# Patient Record
Sex: Female | Born: 1945 | Race: White | Hispanic: No | Marital: Single | Smoking: Never smoker
Health system: Southern US, Community
[De-identification: ages and names within clinical notes are randomized; demographics above are authoritative.]

## PROBLEM LIST (undated history)

## (undated) DIAGNOSIS — I10 Essential (primary) hypertension: Secondary | ICD-10-CM

## (undated) DIAGNOSIS — Z9889 Other specified postprocedural states: Secondary | ICD-10-CM

## (undated) DIAGNOSIS — E119 Type 2 diabetes mellitus without complications: Secondary | ICD-10-CM

## (undated) DIAGNOSIS — F79 Unspecified intellectual disabilities: Secondary | ICD-10-CM

## (undated) DIAGNOSIS — K449 Diaphragmatic hernia without obstruction or gangrene: Secondary | ICD-10-CM

## (undated) HISTORY — DX: Unspecified intellectual disabilities: F79

## (undated) HISTORY — PX: ABDOMINAL HYSTERECTOMY: SHX81

## (undated) HISTORY — DX: Diaphragmatic hernia without obstruction or gangrene: K44.9

## (undated) HISTORY — DX: Other specified postprocedural states: Z98.890

---

## 1998-01-07 ENCOUNTER — Encounter: Payer: Self-pay | Admitting: Emergency Medicine

## 1998-01-07 ENCOUNTER — Emergency Department (HOSPITAL_COMMUNITY): Admission: EM | Admit: 1998-01-07 | Discharge: 1998-01-07 | Payer: Self-pay | Admitting: Emergency Medicine

## 1999-01-05 ENCOUNTER — Encounter (HOSPITAL_COMMUNITY): Admission: RE | Admit: 1999-01-05 | Discharge: 1999-04-05 | Payer: Self-pay | Admitting: Dentistry

## 1999-01-06 ENCOUNTER — Encounter (HOSPITAL_COMMUNITY): Payer: Self-pay | Admitting: Dentistry

## 1999-01-12 ENCOUNTER — Encounter (HOSPITAL_COMMUNITY): Payer: Self-pay | Admitting: Dentistry

## 1999-01-12 ENCOUNTER — Ambulatory Visit (HOSPITAL_COMMUNITY): Admission: RE | Admit: 1999-01-12 | Discharge: 1999-01-12 | Payer: Self-pay | Admitting: Dentistry

## 2000-01-17 ENCOUNTER — Encounter: Payer: Self-pay | Admitting: Emergency Medicine

## 2000-01-17 ENCOUNTER — Emergency Department (HOSPITAL_COMMUNITY): Admission: EM | Admit: 2000-01-17 | Discharge: 2000-01-17 | Payer: Self-pay | Admitting: Emergency Medicine

## 2000-02-21 ENCOUNTER — Encounter (HOSPITAL_COMMUNITY): Admission: RE | Admit: 2000-02-21 | Discharge: 2000-05-21 | Payer: Self-pay | Admitting: Dentistry

## 2000-09-04 ENCOUNTER — Encounter (HOSPITAL_COMMUNITY): Admission: RE | Admit: 2000-09-04 | Discharge: 2000-12-03 | Payer: Self-pay | Admitting: Dentistry

## 2001-03-18 ENCOUNTER — Encounter (HOSPITAL_COMMUNITY): Admission: RE | Admit: 2001-03-18 | Discharge: 2001-06-16 | Payer: Self-pay | Admitting: Dentistry

## 2001-04-17 LAB — HM COLONOSCOPY

## 2001-06-18 ENCOUNTER — Ambulatory Visit: Admission: RE | Admit: 2001-06-18 | Discharge: 2001-06-18 | Payer: Self-pay | Admitting: Internal Medicine

## 2001-09-25 ENCOUNTER — Ambulatory Visit (HOSPITAL_COMMUNITY): Admission: RE | Admit: 2001-09-25 | Discharge: 2001-09-25 | Payer: Self-pay | Admitting: Gastroenterology

## 2001-10-22 ENCOUNTER — Encounter (HOSPITAL_COMMUNITY): Admission: RE | Admit: 2001-10-22 | Discharge: 2002-01-20 | Payer: Self-pay | Admitting: Dentistry

## 2002-07-02 ENCOUNTER — Encounter: Admission: RE | Admit: 2002-07-02 | Discharge: 2002-07-02 | Payer: Self-pay | Admitting: Family Medicine

## 2002-07-02 ENCOUNTER — Encounter: Payer: Self-pay | Admitting: Family Medicine

## 2002-12-02 ENCOUNTER — Encounter: Admission: RE | Admit: 2002-12-02 | Discharge: 2002-12-02 | Payer: Self-pay | Admitting: Specialist

## 2002-12-02 ENCOUNTER — Encounter: Payer: Self-pay | Admitting: Specialist

## 2002-12-30 ENCOUNTER — Encounter: Admission: EM | Admit: 2002-12-30 | Discharge: 2002-12-30 | Payer: Self-pay | Admitting: Dentistry

## 2004-02-22 ENCOUNTER — Ambulatory Visit: Payer: Self-pay | Admitting: Internal Medicine

## 2004-05-02 ENCOUNTER — Ambulatory Visit: Payer: Self-pay | Admitting: Internal Medicine

## 2004-06-01 ENCOUNTER — Ambulatory Visit: Payer: Self-pay | Admitting: Internal Medicine

## 2004-10-22 ENCOUNTER — Ambulatory Visit: Payer: Self-pay | Admitting: Family Medicine

## 2004-10-25 ENCOUNTER — Ambulatory Visit: Payer: Self-pay | Admitting: Family Medicine

## 2004-10-28 ENCOUNTER — Ambulatory Visit (HOSPITAL_COMMUNITY): Admission: RE | Admit: 2004-10-28 | Discharge: 2004-10-28 | Payer: Self-pay | Admitting: Internal Medicine

## 2004-11-18 ENCOUNTER — Ambulatory Visit: Payer: Self-pay | Admitting: Internal Medicine

## 2004-11-18 ENCOUNTER — Encounter: Admission: RE | Admit: 2004-11-18 | Discharge: 2004-11-18 | Payer: Self-pay | Admitting: Internal Medicine

## 2004-12-06 ENCOUNTER — Ambulatory Visit: Payer: Self-pay | Admitting: Gastroenterology

## 2005-01-02 ENCOUNTER — Ambulatory Visit: Payer: Self-pay | Admitting: Gastroenterology

## 2005-02-07 ENCOUNTER — Ambulatory Visit: Payer: Self-pay | Admitting: Internal Medicine

## 2005-04-17 HISTORY — PX: ESOPHAGOGASTRODUODENOSCOPY: SHX1529

## 2005-05-01 ENCOUNTER — Ambulatory Visit: Payer: Self-pay | Admitting: Internal Medicine

## 2005-05-15 ENCOUNTER — Ambulatory Visit: Payer: Self-pay | Admitting: Internal Medicine

## 2005-06-02 ENCOUNTER — Ambulatory Visit: Payer: Self-pay | Admitting: Internal Medicine

## 2005-06-08 ENCOUNTER — Ambulatory Visit: Payer: Self-pay | Admitting: Internal Medicine

## 2005-06-12 ENCOUNTER — Ambulatory Visit: Payer: Self-pay | Admitting: Gastroenterology

## 2005-06-23 ENCOUNTER — Ambulatory Visit: Payer: Self-pay | Admitting: Gastroenterology

## 2005-07-06 ENCOUNTER — Ambulatory Visit: Payer: Self-pay | Admitting: Internal Medicine

## 2005-07-14 ENCOUNTER — Encounter: Admission: RE | Admit: 2005-07-14 | Discharge: 2005-07-14 | Payer: Self-pay | Admitting: Internal Medicine

## 2005-08-25 ENCOUNTER — Ambulatory Visit: Payer: Self-pay | Admitting: Internal Medicine

## 2006-01-29 ENCOUNTER — Ambulatory Visit: Payer: Self-pay | Admitting: Family Medicine

## 2006-07-27 ENCOUNTER — Ambulatory Visit: Payer: Self-pay | Admitting: Internal Medicine

## 2006-08-10 ENCOUNTER — Ambulatory Visit: Payer: Self-pay | Admitting: Internal Medicine

## 2006-08-20 ENCOUNTER — Ambulatory Visit: Payer: Self-pay | Admitting: Internal Medicine

## 2006-12-04 DIAGNOSIS — J45909 Unspecified asthma, uncomplicated: Secondary | ICD-10-CM | POA: Insufficient documentation

## 2007-02-15 ENCOUNTER — Ambulatory Visit: Payer: Self-pay | Admitting: Internal Medicine

## 2007-11-15 ENCOUNTER — Ambulatory Visit: Payer: Self-pay | Admitting: Internal Medicine

## 2007-11-15 DIAGNOSIS — R071 Chest pain on breathing: Secondary | ICD-10-CM

## 2007-11-15 DIAGNOSIS — F79 Unspecified intellectual disabilities: Secondary | ICD-10-CM

## 2008-02-03 ENCOUNTER — Ambulatory Visit: Payer: Self-pay | Admitting: Internal Medicine

## 2008-03-16 ENCOUNTER — Ambulatory Visit: Payer: Self-pay | Admitting: Internal Medicine

## 2008-03-16 DIAGNOSIS — S59909A Unspecified injury of unspecified elbow, initial encounter: Secondary | ICD-10-CM

## 2008-03-16 DIAGNOSIS — S59919A Unspecified injury of unspecified forearm, initial encounter: Secondary | ICD-10-CM

## 2008-03-16 DIAGNOSIS — S6990XA Unspecified injury of unspecified wrist, hand and finger(s), initial encounter: Secondary | ICD-10-CM | POA: Insufficient documentation

## 2008-03-16 DIAGNOSIS — F411 Generalized anxiety disorder: Secondary | ICD-10-CM | POA: Insufficient documentation

## 2008-03-19 ENCOUNTER — Telehealth: Payer: Self-pay | Admitting: *Deleted

## 2008-07-31 ENCOUNTER — Telehealth: Payer: Self-pay | Admitting: Internal Medicine

## 2008-07-31 ENCOUNTER — Emergency Department (HOSPITAL_COMMUNITY): Admission: EM | Admit: 2008-07-31 | Discharge: 2008-08-01 | Payer: Self-pay | Admitting: Emergency Medicine

## 2008-07-31 ENCOUNTER — Encounter: Payer: Self-pay | Admitting: Internal Medicine

## 2008-09-21 ENCOUNTER — Ambulatory Visit: Payer: Self-pay | Admitting: Internal Medicine

## 2008-09-21 DIAGNOSIS — H6120 Impacted cerumen, unspecified ear: Secondary | ICD-10-CM

## 2008-09-21 DIAGNOSIS — R319 Hematuria, unspecified: Secondary | ICD-10-CM

## 2008-09-21 LAB — CONVERTED CEMR LAB
Bilirubin Urine: NEGATIVE
Glucose, Urine, Semiquant: NEGATIVE
Ketones, urine, test strip: NEGATIVE

## 2008-09-22 ENCOUNTER — Encounter: Payer: Self-pay | Admitting: Internal Medicine

## 2008-10-13 ENCOUNTER — Ambulatory Visit: Payer: Self-pay | Admitting: Internal Medicine

## 2008-10-13 DIAGNOSIS — R519 Headache, unspecified: Secondary | ICD-10-CM | POA: Insufficient documentation

## 2008-10-13 DIAGNOSIS — R51 Headache: Secondary | ICD-10-CM | POA: Insufficient documentation

## 2008-10-13 DIAGNOSIS — R32 Unspecified urinary incontinence: Secondary | ICD-10-CM | POA: Insufficient documentation

## 2008-10-13 LAB — CONVERTED CEMR LAB
Ketones, urine, test strip: NEGATIVE
Specific Gravity, Urine: 1.01
WBC Urine, dipstick: NEGATIVE
pH: 5

## 2008-10-14 ENCOUNTER — Encounter: Payer: Self-pay | Admitting: Internal Medicine

## 2008-10-22 ENCOUNTER — Ambulatory Visit: Payer: Self-pay | Admitting: Internal Medicine

## 2008-10-28 LAB — CONVERTED CEMR LAB
CRP, High Sensitivity: 1 (ref 0.00–5.00)
TSH: 1.09 microintl units/mL (ref 0.35–5.50)

## 2008-10-30 ENCOUNTER — Telehealth: Payer: Self-pay | Admitting: *Deleted

## 2009-02-03 ENCOUNTER — Ambulatory Visit: Payer: Self-pay | Admitting: Internal Medicine

## 2009-02-03 DIAGNOSIS — T50995A Adverse effect of other drugs, medicaments and biological substances, initial encounter: Secondary | ICD-10-CM | POA: Insufficient documentation

## 2009-02-03 DIAGNOSIS — R141 Gas pain: Secondary | ICD-10-CM

## 2009-02-03 DIAGNOSIS — R143 Flatulence: Secondary | ICD-10-CM

## 2009-02-03 DIAGNOSIS — R6250 Unspecified lack of expected normal physiological development in childhood: Secondary | ICD-10-CM

## 2009-02-03 DIAGNOSIS — R197 Diarrhea, unspecified: Secondary | ICD-10-CM

## 2009-02-03 DIAGNOSIS — R142 Eructation: Secondary | ICD-10-CM

## 2009-02-04 ENCOUNTER — Telehealth: Payer: Self-pay | Admitting: Internal Medicine

## 2009-02-08 ENCOUNTER — Telehealth: Payer: Self-pay | Admitting: *Deleted

## 2009-10-04 ENCOUNTER — Ambulatory Visit: Payer: Self-pay | Admitting: Internal Medicine

## 2009-10-04 DIAGNOSIS — L559 Sunburn, unspecified: Secondary | ICD-10-CM

## 2010-01-24 ENCOUNTER — Ambulatory Visit: Payer: Self-pay | Admitting: Internal Medicine

## 2010-04-27 ENCOUNTER — Telehealth: Payer: Self-pay | Admitting: *Deleted

## 2010-04-29 ENCOUNTER — Encounter: Payer: Self-pay | Admitting: Internal Medicine

## 2010-05-08 ENCOUNTER — Encounter: Payer: Self-pay | Admitting: Internal Medicine

## 2010-05-17 ENCOUNTER — Telehealth: Payer: Self-pay | Admitting: *Deleted

## 2010-05-17 ENCOUNTER — Encounter: Payer: Self-pay | Admitting: *Deleted

## 2010-05-17 NOTE — Assessment & Plan Note (Signed)
Summary: FLU SHOT // RS   Nurse Visit   Allergies: No Known Drug Allergies  Orders Added: 1)  Admin 1st Vaccine [90471] 2)  Flu Vaccine 73yrs + [16109]  Flu Vaccine Consent Questions     Do you have a history of severe allergic reactions to this vaccine? no    Any prior history of allergic reactions to egg and/or gelatin? no    Do you have a sensitivity to the preservative Thimersol? no    Do you have a past history of Guillan-Barre Syndrome? no    Do you currently have an acute febrile illness? no    Have you ever had a severe reaction to latex? no    Vaccine information given and explained to patient? yes    Are you currently pregnant? no    Lot Number:[id number]   Exp Date:10/15/2010   Site Given  Left Deltoid IM Romualdo Bolk, CMA (AAMA)  January 24, 2010 11:30 AM

## 2010-05-17 NOTE — Assessment & Plan Note (Signed)
Summary: ? sunburn//ccm   Vital Signs:  Patient profile:   65 year old female Menstrual status:  hysterectomy Height:      62 inches Weight:      106 pounds BMI:     19.46 Temp:     98.0 degrees F oral Pulse rate:   60 / minute BP sitting:   100 / 60  (right arm) Cuff size:   regular  Vitals Entered By: Romualdo Bolk, CMA (AAMA) (Kaitlinn 20, 2011 1:27 PM) CC: Pt is having burning on her back x 3 weeks. Pt states that she got sunburned 3 weeks ago.   History of Present Illness: Colleen Aguilar comesin comes in today  with mom   for sda with mom for  continued pain on back after getting a sunburn staying too long at poolside .  NO blistering fever .   Used   cool d ocmpresses.  Says mpain continuing on back. OTherwise no change in health or medications.   Preventive Screening-Counseling & Management  Alcohol-Tobacco     Alcohol drinks/day: 0     Smoking Status: never  Caffeine-Diet-Exercise     Caffeine use/day: none     Does Patient Exercise: yes     Type of exercise: walking  Current Medications (verified): 1)  Advair Diskus 100-50 Mcg/dose Misc (Fluticasone-Salmeterol) .... 2 Puffs Two Times A Day 2)  Ventolin Hfa 108 (90 Base) Mcg/act Aers (Albuterol Sulfate) .Marland Kitchen.. 1-2 Puffs Q 4-6 Hours As Needed 3)  Bentyl 20 Mg Tabs (Dicyclomine Hcl) .Marland Kitchen.. 1 By Mouth Three Times A Day To Qid As Needed For Irritable Bowel  Allergies (verified): No Known Drug Allergies  Past History:  Past medical, surgical, family and social histories (including risk factors) reviewed, and no changes noted (except as noted below).  Past Medical History: "Asthma"  Unable to do PFTs  ra as such with inhalers and empitic response  colonoscopy  2003 egd 2007  Hiatal hernia  hx of psychogenic dysphagia  special needs   Hx of mental retardation   lives with mom     Past Surgical History: Reviewed history from 03/16/2008 and no changes required. Hysterectomy   Past History:  Care Management: None  Current Neurology:Dr.Willis  Family History: Reviewed history from 11/15/2007 and no changes required. mom type 2 diabetic   controlled  lipids  Social History: Reviewed history from 02/03/2009 and no changes required. Single    mental retardation developmental deficits  Never Smoked Alcohol use-no Drug use-no  lives with mom no tobacco    mom with health issues this year. Sib died in past year  Review of Systems  The patient denies fever, chest pain, enlarged lymph nodes, and angioedema.         no rahses currently and no shingle  Physical Exam  General:  alert, well-developed, and well-hydrated.  alert in nad  Head:  atraumatic.   Neck:  No deformities, masses, or tenderness noted. Lungs:  Normal respiratory effort, chest expands symmetrically. Lungs are clear to auscultation, no crackles or wheezes. Heart:  Normal rate and regular rhythm. S1 and S2 normal without gallop, murmur, click, rub or other extra sounds. Neurologic:  non f ocal   limited cogntive ability  nl speech Skin:  turgor normal and color normal.  o lesions on back and no currently peeling  points to med badk upper shuldres to area of  sensitivity  . Cervical Nodes:  No lymphadenopathy noted Psych:  Oriented X3 and good eye contact.  developmentally delayed    Impression & Recommendations:  Problem # 1:  SUNBURN (ICD-692.71) hx of same but currently skin appears healed .  persistent  pain unexplained however pt has known to be sensitive to other esternal insults and  has some augmentaion of her signs . currently seems tto not have other process  going on.   careful observation and skin protection .   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 2 puffs two times a day 2)  Ventolin Hfa 108 (90 Base) Mcg/act Aers (Albuterol sulfate) .Marland Kitchen.. 1-2 puffs q 4-6 hours as needed 3)  Bentyl 20  Mg Tabs (Dicyclomine hcl) .Marland Kitchen.. 1 by mouth three times a day to qid as needed for irritable bowel  Patient Instructions: 1)  avoid sun during peak hours such as 11am to 3 pm 2)  Suggest  cool compresses not too cold. 3)  Try applying  moistuizer  ointment two times a day such as Aqua for. 4)  Expect pain and sensitivity to decrease over the next 2 weeks or less.  5)  Call if you get a rash  develop in the menatime . 6)  Ok to use tylenol or ASA for the pain.

## 2010-05-19 NOTE — Medication Information (Signed)
Summary: PA and Approval for Advair Diskus  PA and Approval for Advair Diskus   Imported By: Maryln Gottron 05/03/2010 13:41:08  _____________________________________________________________________  External Attachment:    Type:   Image     Comment:   External Document

## 2010-05-19 NOTE — Progress Notes (Signed)
Summary: Pt needs to have a form completed re: Advair.  Phone Note Call from Patient Call back at Home Phone [phone]   Caller: mother - Karin Golden Summary of Call: Pts mom called and said that she needs to come in this wk and have doctor fill in a form for medicaid in order to get meds, to be able to keep the advair med. Since Dr. Fabian Sharp is out this wk, can this be done by anoher doctor? Pls advise.  Initial call taken by: Lucy Antigua,  April 27, 2010 1:04 PM  Follow-up for Phone Call        Tell her to bring it by and we will see what we can do. Follow-up by: Romualdo Bolk, CMA Duncan Dull),  April 27, 2010 1:07 PM  Additional Follow-up for Phone Call Additional follow up Details #1::        Pts mom called and said that she doesnt have form, she thought that Dr. Fabian Sharp would have whatever she would need to get this done. Pls call pts mom discuss.  Additional Follow-up by: Lucy Antigua,  April 27, 2010 4:57 PM    Additional Follow-up for Phone Call Additional follow up Details #2::    Spoke to pt's mother- She states that pt is going to be cut off medicaid on 05/18/10 unless we fill out a form. She was thinking that we have the forms here. I told her to contact her caseworker about the letter that she got and that we could try to fill out the forms once she gets them. Follow-up by: Romualdo Bolk, CMA (AAMA),  April 28, 2010 8:19 AM

## 2010-05-25 NOTE — Letter (Signed)
Summary: Generic Letter  [facility]  [address] [place], [place] [address]   Phone: [phone]  Fax: [phone]    05/17/2010  ANDRIENNE HAVENER [address] [place], [place]  [address]  Dear Ms. Ozanich,  We recieved a refill request from your pharmacy. In order to ensure proper care you are due for a annual exam. Please give [place] a call at [phone] to schedule this appointment.         Sincerely,   Tor Netters, CMA (AAMA)

## 2010-05-25 NOTE — Progress Notes (Signed)
  Phone Note Call from Patient   Caller: Mom Call For: Madelin Headings MD Summary of Call:  Mother calling from [facility]) stating no inhaler is there????  Please confirm.  Is this an insurance issue? Initial call taken by: Lynann Beaver CMA AAMA,  May 17, 2010 3:51 PM  Follow-up for Phone Call        Spoke wtih Clydie Braun at the pharmacy and called it in. She said that they never recieved the rx that went electronically. I called it in for 3 refills. Mom aware of this. Follow-up by: Romualdo Bolk, CMA (AAMA),  May 17, 2010 4:01 PM

## 2010-07-27 LAB — DIFFERENTIAL
Basophils Absolute: 0 10*3/uL (ref 0.0–0.1)
Lymphocytes Relative: 31 % (ref 12–46)
Monocytes Absolute: 0.5 10*3/uL (ref 0.1–1.0)
Monocytes Relative: 6 % (ref 3–12)
Neutrophils Relative %: 62 % (ref 43–77)

## 2010-07-27 LAB — POCT I-STAT, CHEM 8
BUN: 19 mg/dL (ref 6–23)
Chloride: 104 mEq/L (ref 96–112)
Creatinine, Ser: 1 mg/dL (ref 0.4–1.2)
Potassium: 4.3 mEq/L (ref 3.5–5.1)
Sodium: 138 mEq/L (ref 135–145)

## 2010-07-27 LAB — HEPATIC FUNCTION PANEL
AST: 30 U/L (ref 0–37)
Bilirubin, Direct: 0.2 mg/dL (ref 0.0–0.3)
Total Bilirubin: 0.6 mg/dL (ref 0.3–1.2)
Total Protein: 7.4 g/dL (ref 6.0–8.3)

## 2010-07-27 LAB — CBC
HCT: 39.5 % (ref 36.0–46.0)
MCHC: 34.3 g/dL (ref 30.0–36.0)
Platelets: 352 10*3/uL (ref 150–400)

## 2010-07-27 LAB — PROTIME-INR
INR: 1 (ref 0.00–1.49)
Prothrombin Time: 13.1 seconds (ref 11.6–15.2)

## 2010-07-27 LAB — URINALYSIS, ROUTINE W REFLEX MICROSCOPIC
Bilirubin Urine: NEGATIVE
Glucose, UA: NEGATIVE mg/dL
Nitrite: NEGATIVE
Protein, ur: NEGATIVE mg/dL
Urobilinogen, UA: 0.2 mg/dL (ref 0.0–1.0)
pH: 7.5 (ref 5.0–8.0)

## 2010-07-27 LAB — LIPASE, BLOOD: Lipase: 32 U/L (ref 11–59)

## 2010-07-27 LAB — TYPE AND SCREEN: Antibody Screen: NEGATIVE

## 2010-07-27 LAB — APTT: aPTT: 31 seconds (ref 24–37)

## 2010-08-11 ENCOUNTER — Encounter: Payer: Self-pay | Admitting: Internal Medicine

## 2010-08-12 ENCOUNTER — Ambulatory Visit (INDEPENDENT_AMBULATORY_CARE_PROVIDER_SITE_OTHER): Payer: Medicaid Other | Admitting: Internal Medicine

## 2010-08-12 ENCOUNTER — Encounter: Payer: Self-pay | Admitting: Internal Medicine

## 2010-08-12 VITALS — BP 118/80 | HR 62 | Temp 97.7°F | Ht 62.0 in | Wt 100.0 lb

## 2010-08-12 DIAGNOSIS — R319 Hematuria, unspecified: Secondary | ICD-10-CM

## 2010-08-12 DIAGNOSIS — R32 Unspecified urinary incontinence: Secondary | ICD-10-CM

## 2010-08-12 DIAGNOSIS — F411 Generalized anxiety disorder: Secondary | ICD-10-CM

## 2010-08-12 DIAGNOSIS — R636 Underweight: Secondary | ICD-10-CM | POA: Insufficient documentation

## 2010-08-12 DIAGNOSIS — G8929 Other chronic pain: Secondary | ICD-10-CM | POA: Insufficient documentation

## 2010-08-12 DIAGNOSIS — R197 Diarrhea, unspecified: Secondary | ICD-10-CM

## 2010-08-12 DIAGNOSIS — Z13220 Encounter for screening for lipoid disorders: Secondary | ICD-10-CM

## 2010-08-12 DIAGNOSIS — R6250 Unspecified lack of expected normal physiological development in childhood: Secondary | ICD-10-CM

## 2010-08-12 DIAGNOSIS — Z0000 Encounter for general adult medical examination without abnormal findings: Secondary | ICD-10-CM

## 2010-08-12 DIAGNOSIS — J45909 Unspecified asthma, uncomplicated: Secondary | ICD-10-CM

## 2010-08-12 LAB — CBC WITH DIFFERENTIAL/PLATELET
Eosinophils Relative: 0.8 % (ref 0.0–5.0)
HCT: 40 % (ref 36.0–46.0)
Hemoglobin: 13.7 g/dL (ref 12.0–15.0)
Lymphs Abs: 2.1 10*3/uL (ref 0.7–4.0)
MCV: 96.6 fl (ref 78.0–100.0)
Monocytes Absolute: 0.3 10*3/uL (ref 0.1–1.0)
Monocytes Relative: 4.9 % (ref 3.0–12.0)
Neutrophils Relative %: 60.4 % (ref 43.0–77.0)
RBC: 4.15 Mil/uL (ref 3.87–5.11)
RDW: 13.1 % (ref 11.5–14.6)

## 2010-08-12 LAB — POCT URINALYSIS DIPSTICK
Glucose, UA: NEGATIVE
Nitrite, UA: NEGATIVE
Protein, UA: NEGATIVE
Spec Grav, UA: 1.015

## 2010-08-12 LAB — BASIC METABOLIC PANEL
BUN: 14 mg/dL (ref 6–23)
CO2: 30 mEq/L (ref 19–32)
Calcium: 9.5 mg/dL (ref 8.4–10.5)
Chloride: 101 mEq/L (ref 96–112)
Creatinine, Ser: 0.6 mg/dL (ref 0.4–1.2)
Glucose, Bld: 77 mg/dL (ref 70–99)

## 2010-08-12 LAB — HEPATIC FUNCTION PANEL
ALT: 13 U/L (ref 0–35)
AST: 22 U/L (ref 0–37)
Albumin: 4 g/dL (ref 3.5–5.2)
Total Protein: 7 g/dL (ref 6.0–8.3)

## 2010-08-12 LAB — LIPID PANEL
HDL: 47.4 mg/dL (ref >39.00)
Triglycerides: 106 mg/dL (ref 0.0–149.0)
VLDL: 21.2 mg/dL (ref 0.0–40.0)

## 2010-08-12 NOTE — Progress Notes (Signed)
Subjective:    Colleen Aguilar is a 65 y.o. female who presents with mom for  Medicare visit wellness and  Exam.  Since  this last visit she has had no major changes in health status and is doing as well asPossible.  No hospital visits change in medication or major injuries. She did hit her knees on a bed frame and they were sore in the front but she is walking okay. She has multiple complaints but these are not changing over the years. She is remained active and a few times a week goes to a church day program and helps out. Her mother says she enjoys this very much and looks forward to it.  She did have an eye exam Dr. Dione Booze and she got new glasses recently sees pretty well.  Has some allergies and some chronic headaches but takes no long-term medication and it doesn't change her activity level. She has a presumed diagnosis of asthma but it was never able to be confirmed. Takes medication only as needed Advair. She has very few teeth so her diet has changed because of that tends to eat only soft foods. She declines most preventive care measures because of bad experiences in the past butis willing to get her  blood teststoday. Cardiac risk factors: sedentary lifestyle.  Activities of Daily Living  In your present state of health, do you have any difficulty performing the following activities?:  Preparing food and eating?: No Bathing yourself: No Getting dressed: No Using the toilet:No Moving around from place to place: No In the past year have you fallen or had a near fall?:No  Current exercise habits: 2 x  Per week  Dietary issues discussed: soft foods few teeth   Some foods give her a stomach problem . No vitamins   Depression Screen (Note: if answer to either of the following is "Yes", then a more complete depression screening is indicated)  Q1: Over the past two weeks, have you felt down, depressed or hopeless?no Q2: Over the past two weeks, have you felt little interest or pleasure  in doing things? no   The following portions of the patient's history were reviewed and updated as appropriate: allergies, current medications, past family history, past medical history, past social history, past surgical history and problem list. Review of Systems Pertinent items are noted in HPI.   See below   Objective:   Cardiac risk factors: none.  Activities of Daily Living  In your present state of health, do you have any difficulty performing the following activities?:  Preparing food and eating?: Yes Bathing yourself: Yes Getting dressed: Yes Using the toilet:Yes Moving around from place to place: Yes In the past year have you fallen or had a near fall?:No  Current exercise habits:  Walks 2 days a week  Dietary issues discussed: yes   Tea and water.    Ham and  meat potatoes not a lot of fruit  And beges  Cheese   Only one tooth.   Hearing:  Excellent   Vision see above   Safety: No falls .  Has smoke detector and wears seat belts.  No firearms. No excess sun exposure. Sees dentist regularly   Advance directive :  Reviewed   Memory: stable   Depression Screen (Note: if answer to either of the following is "Yes", then a more complete depression screening is indicated)  Q1: Over the past two weeks, have you felt down, depressed or hopeless?no Q2: Over the past  two weeks, have you felt little interest or pleasure in doing things? no   The following portions of the patient's history were reviewed and updated as appropriate: allergies, current medications, past family history, past medical history, past social history, past surgical history and problem list. Review of Systems   As per hpi Diarrhea with some foods.   No sleeping  Urinates  Frequently  Each night.    NO UI per say and no UTI sx . No current cp sob or wheeze   Bleeding  . Rest of ros neg or [place] Objective:     Vision by Snellen chart: right  Blood pressure 118/80, pulse 62, temperature 97.7 F (36.5 C),  temperature source Oral, height 5\' 2"  (1.575 m), weight 100 lb (45.36 kg), SpO2 98.00%. Body mass index is 18.29 kg/(m^2). Physical Exam: Vital signs reviewed ZOX:WRUE is a well-developed well-nourished alert cooperative  white female who appears her stated age in no acute distress.  HEENT: normocephalic  traumatic , Eyes: PERRL EOM's full, conjunctiva clear, Nares: paten,t no deformity discharge or tenderness., Ears: no deformity EAC's clear TMs with normal landmarks. Mouth: clear OP, no lesions, edema.  Moist mucous membranes. Dentition few teeth. NECK: supple without masses, thyromegaly or bruits. CHEST/PULM:  Clear to auscultation and percussion breath sounds equal no wheeze , rales or rhonchi. No chest wall deformities or tenderness. Breast: normal by inspection . No dimpling, discharge, masses, tenderness or discharge . LN: no cervical axillary inguinal adenopathy CV: PMI is nondisplaced, S1 S2 no gallops, murmurs, rubs. Peripheral pulses are full without delay.No JVD .  ABDOMEN: Bowel sounds normal nontender  No guard or rebound, no hepato splenomegal no CVA tenderness.  No hernia. Extremtities:  No clubbing cyanosis or edema, no acute joint swelling or redness no focal atrophy  Knee nl rom minldly tender anterior  Knee no redness or warmth or bruising. Nl gait NEURO:  Oriented x3, cranial nerves 3-12 appear to be intact, no obvious focal weakness,gait within normal limits no abnormal reflexes or asymmetrical SKIN: No acute rashes normal turgor, color, no bruising or petechiae. PSYCH: Oriented, good eye contact, midly anxious  Bu cooperative  And normal interacction for her level of cognition. GYNE declined   Assessment:  HCM    Declines reg mammo and colon as has had traumatic experiences with these screening methods.  Will do labs today at her agreement.  Mental retardation  Stable  Can do all adls Anxiety related to above  Stable  Functioning at pretty good level. Headache  With  history of saying no alarm symptoms will watch for now Knee pain apparently minor injury should do well reassurance Possible asthma stable  UI  Hx of same hyperactive bladder  No change    Plan:     During the course of the visit the patient was educated and counseled about appropriate screening and preventive services including:     Patient Instructions (the written plan) was given to the patient.   Medicare Attestation I have personally reviewed: The patient's medical and social history Their use of alcohol, tobacco or illicit drugs Their current medications and supplements The patient's functional ability including ADLs,fall risks, home safety risks, cognitive, and hearing and visual impairment Diet and physical activities Evidence for depression or mood disorders  The patient's weight, height, BMI, and visual acuity have been recorded in the chart.  I have made referrals, counseling, and provided education to the patient / mother who is her legal gurardian based on review  of the above and I have provided the patient with a written personalized care plan for preventive services.

## 2010-08-16 ENCOUNTER — Encounter: Payer: Self-pay | Admitting: *Deleted

## 2010-08-19 ENCOUNTER — Telehealth: Payer: Self-pay | Admitting: *Deleted

## 2010-08-19 MED ORDER — FLUTICASONE-SALMETEROL 100-50 MCG/DOSE IN AEPB: 1.0000 | INHALATION_SPRAY | Freq: Two times a day (BID) | RESPIRATORY_TRACT | Status: DC

## 2010-08-19 NOTE — Telephone Encounter (Signed)
Refill on advair Rx sent to pharmacy

## 2010-08-21 ENCOUNTER — Encounter: Payer: Self-pay | Admitting: Internal Medicine

## 2010-09-01 DIAGNOSIS — Z0289 Encounter for other administrative examinations: Secondary | ICD-10-CM

## 2010-09-13 ENCOUNTER — Telehealth: Payer: Self-pay | Admitting: *Deleted

## 2010-09-13 DIAGNOSIS — F79 Unspecified intellectual disabilities: Secondary | ICD-10-CM

## 2010-09-13 DIAGNOSIS — F411 Generalized anxiety disorder: Secondary | ICD-10-CM

## 2010-09-13 DIAGNOSIS — R6250 Unspecified lack of expected normal physiological development in childhood: Secondary | ICD-10-CM

## 2010-09-13 NOTE — Telephone Encounter (Signed)
Brother is wondering if pt needs more assistance and has she been a proper dx for her. They would like her to see psych and neuro in adult mental retardation. The assisted living that they are looking at is not sure they properly able to take care of Colleen Aguilar. He states that the caseworker they think is not helping them correctly. He just wants to make sure that pt is placed properly. Pt lies a lot and steals at home.

## 2010-09-15 NOTE — Telephone Encounter (Signed)
Pt's brother aware of appt with [facility] on Lilianne 15th 9am. [facility] said that they need a referral to get pt in sooner. Order sent to [facility].

## 2010-10-26 ENCOUNTER — Telehealth: Payer: Self-pay | Admitting: Internal Medicine

## 2010-10-26 NOTE — Telephone Encounter (Signed)
Pt's son called and stated that his mother needs an FL2 form for Assisted Living .  Pt's son would like a copy faxed to him as well at [phone]

## 2010-10-26 NOTE — Telephone Encounter (Signed)
Requesting additional paperwork for long term care. Need copy from month of May. She need copy for August. Please call pt for more info. Hard to understand. She stated that Carollee Herter knows about this.

## 2010-10-27 NOTE — Telephone Encounter (Signed)
Left message with Pt's brother that his mother needs to bring [place] a FL2 form from the center. We don't keep these here.

## 2010-10-31 DIAGNOSIS — Z0279 Encounter for issue of other medical certificate: Secondary | ICD-10-CM

## 2010-12-06 ENCOUNTER — Ambulatory Visit (INDEPENDENT_AMBULATORY_CARE_PROVIDER_SITE_OTHER): Payer: Medicaid Other | Admitting: Internal Medicine

## 2010-12-06 DIAGNOSIS — Z0000 Encounter for general adult medical examination without abnormal findings: Secondary | ICD-10-CM

## 2010-12-06 DIAGNOSIS — Z111 Encounter for screening for respiratory tuberculosis: Secondary | ICD-10-CM

## 2010-12-13 ENCOUNTER — Telehealth: Payer: Self-pay | Admitting: *Deleted

## 2010-12-13 NOTE — Telephone Encounter (Signed)
Pt needs a note saying that she needs to have labs done here due to small veins. Note given to mom.

## 2010-12-21 ENCOUNTER — Other Ambulatory Visit: Payer: Self-pay | Admitting: Internal Medicine

## 2010-12-21 NOTE — Telephone Encounter (Signed)
LOV 07/2010 NOV none Please advise

## 2010-12-26 ENCOUNTER — Other Ambulatory Visit: Payer: Self-pay | Admitting: *Deleted

## 2010-12-26 NOTE — Telephone Encounter (Signed)
Refill on Proair Last filled on 02/21/10 LOV-08/12/10 NOV-none

## 2010-12-27 MED ORDER — ALBUTEROL SULFATE HFA 108 (90 BASE) MCG/ACT IN AERS: 2.0000 | INHALATION_SPRAY | Freq: Four times a day (QID) | RESPIRATORY_TRACT | Status: AC | PRN

## 2010-12-27 NOTE — Telephone Encounter (Signed)
Per Dr. Fabian Sharp- ok x 3. Rx sent to pharmacy

## 2010-12-31 ENCOUNTER — Emergency Department (HOSPITAL_COMMUNITY)
Admission: EM | Admit: 2010-12-31 | Discharge: 2010-12-31 | Disposition: A | Discharge status: Home / Self Care | Payer: Medicare Other | Attending: Emergency Medicine | Admitting: Emergency Medicine

## 2010-12-31 ENCOUNTER — Emergency Department (HOSPITAL_COMMUNITY): Payer: Medicare Other

## 2010-12-31 DIAGNOSIS — X58XXXA Exposure to other specified factors, initial encounter: Secondary | ICD-10-CM | POA: Insufficient documentation

## 2010-12-31 DIAGNOSIS — T148XXA Other injury of unspecified body region, initial encounter: Secondary | ICD-10-CM | POA: Insufficient documentation

## 2010-12-31 DIAGNOSIS — M542 Cervicalgia: Secondary | ICD-10-CM | POA: Insufficient documentation

## 2010-12-31 DIAGNOSIS — F411 Generalized anxiety disorder: Secondary | ICD-10-CM | POA: Insufficient documentation

## 2010-12-31 DIAGNOSIS — F79 Unspecified intellectual disabilities: Secondary | ICD-10-CM | POA: Insufficient documentation

## 2010-12-31 DIAGNOSIS — R1310 Dysphagia, unspecified: Secondary | ICD-10-CM | POA: Insufficient documentation

## 2010-12-31 DIAGNOSIS — J45909 Unspecified asthma, uncomplicated: Secondary | ICD-10-CM | POA: Insufficient documentation

## 2010-12-31 DIAGNOSIS — R599 Enlarged lymph nodes, unspecified: Secondary | ICD-10-CM | POA: Insufficient documentation

## 2010-12-31 DIAGNOSIS — R079 Chest pain, unspecified: Secondary | ICD-10-CM | POA: Insufficient documentation

## 2010-12-31 LAB — POCT I-STAT, CHEM 8
BUN: 12 mg/dL (ref 6–23)
Calcium, Ion: 1.13 mmol/L (ref 1.12–1.32)
Chloride: 103 mEq/L (ref 96–112)
Creatinine, Ser: 0.7 mg/dL (ref 0.50–1.10)
Glucose, Bld: 105 mg/dL — ABNORMAL HIGH (ref 70–99)

## 2011-02-16 ENCOUNTER — Other Ambulatory Visit: Payer: Self-pay | Admitting: Geriatric Medicine

## 2011-02-16 DIAGNOSIS — Z1231 Encounter for screening mammogram for malignant neoplasm of breast: Secondary | ICD-10-CM

## 2011-03-28 ENCOUNTER — Ambulatory Visit: Payer: Medicaid Other

## 2011-05-23 ENCOUNTER — Ambulatory Visit: Payer: Medicaid Other

## 2011-08-04 ENCOUNTER — Emergency Department (HOSPITAL_COMMUNITY)
Admission: EM | Admit: 2011-08-04 | Discharge: 2011-08-04 | Disposition: A | Discharge status: Home / Self Care | Payer: Medicare Other | Attending: Emergency Medicine | Admitting: Emergency Medicine

## 2011-08-04 ENCOUNTER — Emergency Department (HOSPITAL_COMMUNITY): Payer: Medicare Other

## 2011-08-04 ENCOUNTER — Encounter (HOSPITAL_COMMUNITY): Payer: Self-pay

## 2011-08-04 DIAGNOSIS — J45909 Unspecified asthma, uncomplicated: Secondary | ICD-10-CM | POA: Insufficient documentation

## 2011-08-04 DIAGNOSIS — R079 Chest pain, unspecified: Secondary | ICD-10-CM | POA: Insufficient documentation

## 2011-08-04 DIAGNOSIS — F79 Unspecified intellectual disabilities: Secondary | ICD-10-CM | POA: Insufficient documentation

## 2011-08-04 DIAGNOSIS — E119 Type 2 diabetes mellitus without complications: Secondary | ICD-10-CM | POA: Insufficient documentation

## 2011-08-04 DIAGNOSIS — Z79899 Other long term (current) drug therapy: Secondary | ICD-10-CM | POA: Insufficient documentation

## 2011-08-04 LAB — DIFFERENTIAL
Basophils Absolute: 0 10*3/uL (ref 0.0–0.1)
Basophils Relative: 0 % (ref 0–1)
Eosinophils Relative: 2 % (ref 0–5)
Monocytes Absolute: 0.4 10*3/uL (ref 0.1–1.0)
Monocytes Relative: 6 % (ref 3–12)
Neutro Abs: 4.7 10*3/uL (ref 1.7–7.7)

## 2011-08-04 LAB — TROPONIN I
Troponin I: <0.3 ng/mL (ref <0.30)
Troponin I: <0.3 ng/mL (ref <0.30)

## 2011-08-04 LAB — URINALYSIS, ROUTINE W REFLEX MICROSCOPIC
Glucose, UA: NEGATIVE mg/dL
Hgb urine dipstick: NEGATIVE
Leukocytes, UA: NEGATIVE
Specific Gravity, Urine: 1.009 (ref 1.005–1.030)
Urobilinogen, UA: 0.2 mg/dL (ref 0.0–1.0)

## 2011-08-04 LAB — CBC
HCT: 37 % (ref 36.0–46.0)
Hemoglobin: 12.5 g/dL (ref 12.0–15.0)
MCH: 31.3 pg (ref 26.0–34.0)
MCHC: 33.8 g/dL (ref 30.0–36.0)
MCV: 92.7 fL (ref 78.0–100.0)
RDW: 12.8 % (ref 11.5–15.5)

## 2011-08-04 LAB — POCT I-STAT, CHEM 8
BUN: 13 mg/dL (ref 6–23)
Chloride: 104 mEq/L (ref 96–112)
HCT: 38 % (ref 36.0–46.0)
Sodium: 140 mEq/L (ref 135–145)
TCO2: 25 mmol/L (ref 0–100)

## 2011-08-04 MED ORDER — SODIUM CHLORIDE 0.9 % IV SOLN
INTRAVENOUS | Status: DC
  Administered 2011-08-04: 999 mL via INTRAVENOUS

## 2011-08-04 NOTE — ED Provider Notes (Signed)
History     CSN: [id number]  Arrival date & time 08/04/11  1235   First MD Initiated Contact with Patient 08/04/11 1240      Chief Complaint  Patient presents with  . Chest Pain    (Consider location/radiation/quality/duration/timing/severity/associated sxs/prior treatment) Patient is a 66 y.o. female presenting with chest pain. The history is provided by the patient.  Chest Pain Pertinent negatives for primary symptoms include no shortness of breath, no abdominal pain, no nausea and no vomiting.  Pertinent negatives for associated symptoms include no numbness and no weakness.    patient has had left lower chest pain since last night. It is dull. Comes and goes. No cough no fevers it is somewhat worse with palpation or movement. No abdominal pain. No nausea vomiting diarrhea. She has not had pains like this before. She does not smoke. She has no history of cardiac disease.  Past Medical History  Diagnosis Date  . Asthma     ? dx  clinical dx unable to do pfts response to inhalers  . Hiatal hernia     egd 2007  . Dysphagia     psychogenic  . Mental retardation     special needs lives with mom  . History of colonoscopy     2003  . Diabetes mellitus     Past Surgical History  Procedure Date  . Abdominal hysterectomy   . Esophagogastroduodenoscopy 2007    Family History  Problem Relation Age of Onset  . Diabetes Mother   . Hyperlipidemia Mother   . Other Sister     neurologic disease died from ? prion disease    History  Substance Use Topics  . Smoking status: Never Smoker   . Smokeless tobacco: Not on file  . Alcohol Use: No    OB History    Grav Para Term Preterm Abortions TAB SAB Ect Mult Living                  Review of Systems  Constitutional: Negative for activity change and appetite change.  HENT: Negative for neck stiffness.   Eyes: Negative for pain.  Respiratory: Negative for chest tightness and shortness of breath.   Cardiovascular:  Positive for chest pain. Negative for leg swelling.  Gastrointestinal: Negative for nausea, vomiting, abdominal pain and diarrhea.  Genitourinary: Negative for flank pain.  Musculoskeletal: Negative for back pain.  Skin: Negative for rash.  Neurological: Negative for weakness, numbness and headaches.  Psychiatric/Behavioral: Negative for behavioral problems.    Allergies  Review of patient's allergies indicates no known allergies.  Home Medications   Current Outpatient Rx  Name Route Sig Dispense Refill  . ACETAMINOPHEN 325 MG PO TABS Oral Take 650 mg by mouth every 6 (six) hours as needed. For pain    . ALBUTEROL SULFATE HFA 108 (90 BASE) MCG/ACT IN AERS Inhalation Inhale 2 puffs into the lungs every 6 (six) hours as needed. 1 Inhaler 2  . ALENDRONATE SODIUM 35 MG PO TABS Oral Take 35 mg by mouth every 7 (seven) days. Take with a full glass of water on an empty stomach.    Marland Kitchen CALCIUM CARBONATE-VITAMIN D 500-200 MG-UNIT PO TABS Oral Take 1 tablet by mouth 2 (two) times daily.    Marland Kitchen FLUTICASONE-SALMETEROL 100-50 MCG/DOSE IN AEPB Inhalation Inhale 1 puff into the lungs every 12 (twelve) hours. 60 each 5  . GUAIFENESIN-DM 100-10 MG/5ML PO SYRP Oral Take 10 mLs by mouth 4 (four) times daily as needed. For cough    .  METFORMIN HCL 500 MG PO TABS Oral Take 500 mg by mouth 2 (two) times daily with a meal.    . SERTRALINE HCL 25 MG PO TABS Oral Take 25 mg by mouth daily.      BP 108/62  Pulse 64  Temp(Src) 97.7 F (36.5 C) (Oral)  Resp 20  SpO2 97%  Physical Exam  Nursing note and vitals reviewed. Constitutional: She is oriented to person, place, and time. She appears well-developed and well-nourished.  HENT:  Head: Normocephalic and atraumatic.  Eyes: EOM are normal. Pupils are equal, round, and reactive to light.  Neck: Normal range of motion. Neck supple.  Cardiovascular: Normal rate, regular rhythm and normal heart sounds.   No murmur heard. Pulmonary/Chest: Effort normal and  breath sounds normal. No respiratory distress. She has no wheezes. She has no rales. She exhibits tenderness.       Mild tenderness to left anterior lower chest wall. No crepitance. No rash. No subcutaneous air.  Abdominal: Soft. Bowel sounds are normal. She exhibits no distension. There is no tenderness. There is no rebound and no guarding.  Musculoskeletal: Normal range of motion.  Neurological: She is alert and oriented to person, place, and time. No cranial nerve deficit.  Skin: Skin is warm and dry.  Psychiatric: She has a normal mood and affect. Her speech is normal.    ED Course  Procedures (including critical care time)  Labs Reviewed  POCT I-STAT, CHEM 8 - Abnormal; Notable for the following:    Glucose, Bld 111 (*)    All other components within normal limits  CBC  DIFFERENTIAL  URINALYSIS, ROUTINE W REFLEX MICROSCOPIC  TROPONIN I  TROPONIN I   Dg Chest 2 View  08/04/2011  *RADIOLOGY REPORT*  Clinical Data: 66 year old female with chest pain, recent cough.  CHEST - 2 VIEW  Comparison: 12/31/2010.  Findings: Stable scoliosis.  Stable lung volumes.  Cardiac size and mediastinal contours are within normal limits.  Visualized tracheal air column is within normal limits.  No pneumothorax, pulmonary edema, pleural effusion or confluent pulmonary opacity. No acute osseous abnormality identified.  IMPRESSION: No acute cardiopulmonary abnormality.  Original Report Authenticated By: Harley Hallmark, M.D.     No diagnosis found.   Date: 08/04/2011  Rate: 58  Rhythm: sinus bradycardia  QRS Axis: normal  Intervals: normal  ST/T Wave abnormalities: normal  Conduction Disutrbances:right bundle branch block  Narrative Interpretation:   Old EKG Reviewed: none available     MDM  Lower left-sided chest pain. Mild tenderness. EKG laboratories reassuring. X-ray did not show a cause yet. Patient will get a second set of cardiac enzymes. She'll likely be discharged home. Care turned over  to Dr Golda Acre.        Juliet Rude. Rubin Payor, MD 08/04/11 1535

## 2011-08-04 NOTE — ED Notes (Signed)
Placed call to [facility] for transportation back to [facility]

## 2011-08-04 NOTE — ED Notes (Signed)
EMS reports pt with c/o left sided chest pain worse in the night, intermittant, no SOB or diaphoresis no nausea

## 2011-08-04 NOTE — Discharge Instructions (Signed)
Chest Pain (Nonspecific) It is often hard to give a specific diagnosis for the cause of chest pain. There is always a chance that your pain could be related to something serious, such as a heart attack or a blood clot in the lungs. You need to follow up with your caregiver for further evaluation. CAUSES   Heartburn.   Pneumonia or bronchitis.   Anxiety or stress.   Inflammation around your heart (pericarditis) or lung (pleuritis or pleurisy).   A blood clot in the lung.   A collapsed lung (pneumothorax). It can develop suddenly on its own (spontaneous pneumothorax) or from injury (trauma) to the chest.   Shingles infection (herpes zoster virus).  The chest wall is composed of bones, muscles, and cartilage. Any of these can be the source of the pain.  The bones can be bruised by injury.   The muscles or cartilage can be strained by coughing or overwork.   The cartilage can be affected by inflammation and become sore (costochondritis).  DIAGNOSIS  Lab tests or other studies, such as X-rays, electrocardiography, stress testing, or cardiac imaging, may be needed to find the cause of your pain.  TREATMENT   Treatment depends on what may be causing your chest pain. Treatment may include:   Acid blockers for heartburn.   Anti-inflammatory medicine.   Pain medicine for inflammatory conditions.   Antibiotics if an infection is present.   You may be advised to change lifestyle habits. This includes stopping smoking and avoiding alcohol, caffeine, and chocolate.   You may be advised to keep your head raised (elevated) when sleeping. This reduces the chance of acid going backward from your stomach into your esophagus.   Most of the time, nonspecific chest pain will improve within 2 to 3 days with rest and mild pain medicine.  HOME CARE INSTRUCTIONS   If antibiotics were prescribed, take your antibiotics as directed. Finish them even if you start to feel better.   For the next few  days, avoid physical activities that bring on chest pain. Continue physical activities as directed.   Do not smoke.   Avoid drinking alcohol.   Only take over-the-counter or prescription medicine for pain, discomfort, or fever as directed by your caregiver.   Follow your caregiver's suggestions for further testing if your chest pain does not go away.   Keep any follow-up appointments you made. If you do not go to an appointment, you could develop lasting (chronic) problems with pain. If there is any problem keeping an appointment, you must call to reschedule.  SEEK MEDICAL CARE IF:   You think you are having problems from the medicine you are taking. Read your medicine instructions carefully.   Your chest pain does not go away, even after treatment.   You develop a rash with blisters on your chest.  SEEK IMMEDIATE MEDICAL CARE IF:   You have increased chest pain or pain that spreads to your arm, neck, jaw, back, or abdomen.   You develop shortness of breath, an increasing cough, or you are coughing up blood.   You have severe back or abdominal pain, feel nauseous, or vomit.   You develop severe weakness, fainting, or chills.   You have a fever.  THIS IS AN EMERGENCY. Do not wait to see if the pain will go away. Get medical help at once. Call your local emergency services (911 in [place] Do not drive yourself to the hospital. MAKE SURE YOU:   Understand these instructions.     Will watch your condition.   Will get help right away if you are not doing well or get worse.  Document Released: 01/11/2005 Document Revised: 03/23/2011 Document Reviewed: 11/07/2007 Exit[employer] Patient Information 2012 [employer]. 

## 2017-06-20 ENCOUNTER — Encounter (HOSPITAL_COMMUNITY): Payer: Self-pay

## 2017-06-20 ENCOUNTER — Other Ambulatory Visit: Payer: Self-pay

## 2017-06-20 ENCOUNTER — Inpatient Hospital Stay (HOSPITAL_COMMUNITY)
Admission: EM | Admit: 2017-06-20 | Discharge: 2017-06-25 | DRG: 470 | Disposition: A | Discharge status: Skilled Nursing Facility | Payer: Medicare Other | Attending: Internal Medicine | Admitting: Internal Medicine

## 2017-06-20 ENCOUNTER — Emergency Department (HOSPITAL_COMMUNITY): Payer: Medicare Other

## 2017-06-20 DIAGNOSIS — W1830XA Fall on same level, unspecified, initial encounter: Secondary | ICD-10-CM | POA: Diagnosis present

## 2017-06-20 DIAGNOSIS — J45909 Unspecified asthma, uncomplicated: Secondary | ICD-10-CM | POA: Diagnosis not present

## 2017-06-20 DIAGNOSIS — M810 Age-related osteoporosis without current pathological fracture: Secondary | ICD-10-CM | POA: Diagnosis present

## 2017-06-20 DIAGNOSIS — E1151 Type 2 diabetes mellitus with diabetic peripheral angiopathy without gangrene: Secondary | ICD-10-CM | POA: Diagnosis present

## 2017-06-20 DIAGNOSIS — Z96642 Presence of left artificial hip joint: Secondary | ICD-10-CM

## 2017-06-20 DIAGNOSIS — F419 Anxiety disorder, unspecified: Secondary | ICD-10-CM | POA: Diagnosis present

## 2017-06-20 DIAGNOSIS — Z681 Body mass index (BMI) 19 or less, adult: Secondary | ICD-10-CM | POA: Diagnosis not present

## 2017-06-20 DIAGNOSIS — E119 Type 2 diabetes mellitus without complications: Secondary | ICD-10-CM | POA: Diagnosis not present

## 2017-06-20 DIAGNOSIS — Z79899 Other long term (current) drug therapy: Secondary | ICD-10-CM

## 2017-06-20 DIAGNOSIS — R6250 Unspecified lack of expected normal physiological development in childhood: Secondary | ICD-10-CM | POA: Diagnosis present

## 2017-06-20 DIAGNOSIS — G3184 Mild cognitive impairment, so stated: Secondary | ICD-10-CM | POA: Diagnosis present

## 2017-06-20 DIAGNOSIS — R0981 Nasal congestion: Secondary | ICD-10-CM | POA: Diagnosis not present

## 2017-06-20 DIAGNOSIS — H9203 Otalgia, bilateral: Secondary | ICD-10-CM | POA: Diagnosis not present

## 2017-06-20 DIAGNOSIS — R1310 Dysphagia, unspecified: Secondary | ICD-10-CM | POA: Diagnosis present

## 2017-06-20 DIAGNOSIS — K449 Diaphragmatic hernia without obstruction or gangrene: Secondary | ICD-10-CM | POA: Diagnosis present

## 2017-06-20 DIAGNOSIS — F79 Unspecified intellectual disabilities: Secondary | ICD-10-CM

## 2017-06-20 DIAGNOSIS — Z419 Encounter for procedure for purposes other than remedying health state, unspecified: Secondary | ICD-10-CM

## 2017-06-20 DIAGNOSIS — Z90710 Acquired absence of both cervix and uterus: Secondary | ICD-10-CM | POA: Diagnosis not present

## 2017-06-20 DIAGNOSIS — Z7901 Long term (current) use of anticoagulants: Secondary | ICD-10-CM

## 2017-06-20 DIAGNOSIS — D62 Acute posthemorrhagic anemia: Secondary | ICD-10-CM | POA: Diagnosis not present

## 2017-06-20 DIAGNOSIS — Z7951 Long term (current) use of inhaled steroids: Secondary | ICD-10-CM | POA: Diagnosis not present

## 2017-06-20 DIAGNOSIS — R64 Cachexia: Secondary | ICD-10-CM | POA: Diagnosis present

## 2017-06-20 DIAGNOSIS — Z7984 Long term (current) use of oral hypoglycemic drugs: Secondary | ICD-10-CM

## 2017-06-20 DIAGNOSIS — S72012A Unspecified intracapsular fracture of left femur, initial encounter for closed fracture: Secondary | ICD-10-CM | POA: Diagnosis present

## 2017-06-20 DIAGNOSIS — I824Y9 Acute embolism and thrombosis of unspecified deep veins of unspecified proximal lower extremity: Secondary | ICD-10-CM | POA: Diagnosis not present

## 2017-06-20 DIAGNOSIS — E538 Deficiency of other specified B group vitamins: Secondary | ICD-10-CM | POA: Diagnosis present

## 2017-06-20 DIAGNOSIS — Z833 Family history of diabetes mellitus: Secondary | ICD-10-CM

## 2017-06-20 DIAGNOSIS — S72002A Fracture of unspecified part of neck of left femur, initial encounter for closed fracture: Secondary | ICD-10-CM | POA: Diagnosis present

## 2017-06-20 DIAGNOSIS — I82409 Acute embolism and thrombosis of unspecified deep veins of unspecified lower extremity: Secondary | ICD-10-CM | POA: Diagnosis present

## 2017-06-20 DIAGNOSIS — E876 Hypokalemia: Secondary | ICD-10-CM | POA: Diagnosis present

## 2017-06-20 HISTORY — DX: Type 2 diabetes mellitus without complications: E11.9

## 2017-06-20 LAB — BASIC METABOLIC PANEL
Anion gap: 10 (ref 5–15)
BUN: 10 mg/dL (ref 6–20)
CALCIUM: 9.4 mg/dL (ref 8.9–10.3)
CO2: 28 mmol/L (ref 22–32)
CREATININE: 0.5 mg/dL (ref 0.44–1.00)
Chloride: 101 mmol/L (ref 101–111)
GFR calc Af Amer: >60 mL/min (ref >60)
GFR calc non Af Amer: >60 mL/min (ref >60)
GLUCOSE: 117 mg/dL — AB (ref 65–99)
Potassium: 3.7 mmol/L (ref 3.5–5.1)
SODIUM: 139 mmol/L (ref 135–145)

## 2017-06-20 LAB — CBC WITH DIFFERENTIAL/PLATELET
BASOS ABS: 0 10*3/uL (ref 0.0–0.1)
BASOS PCT: 0 %
EOS ABS: 0.1 10*3/uL (ref 0.0–0.7)
EOS PCT: 1 %
HCT: 37 % (ref 36.0–46.0)
Hemoglobin: 12.2 g/dL (ref 12.0–15.0)
LYMPHS PCT: 15 %
Lymphs Abs: 0.9 10*3/uL (ref 0.7–4.0)
MCH: 31.4 pg (ref 26.0–34.0)
MCHC: 33 g/dL (ref 30.0–36.0)
MCV: 95.4 fL (ref 78.0–100.0)
MONO ABS: 0.4 10*3/uL (ref 0.1–1.0)
Monocytes Relative: 6 %
Neutro Abs: 5 10*3/uL (ref 1.7–7.7)
Neutrophils Relative %: 78 %
PLATELETS: 428 10*3/uL — AB (ref 150–400)
RBC: 3.88 MIL/uL (ref 3.87–5.11)
RDW: 13.9 % (ref 11.5–15.5)
WBC: 6.4 10*3/uL (ref 4.0–10.5)

## 2017-06-20 LAB — URINALYSIS, ROUTINE W REFLEX MICROSCOPIC
Bilirubin Urine: NEGATIVE
GLUCOSE, UA: NEGATIVE mg/dL
Ketones, ur: NEGATIVE mg/dL
Leukocytes, UA: NEGATIVE
Nitrite: NEGATIVE
PH: 9 — AB (ref 5.0–8.0)
Protein, ur: NEGATIVE mg/dL
SPECIFIC GRAVITY, URINE: 1.003 — AB (ref 1.005–1.030)

## 2017-06-20 LAB — TYPE AND SCREEN
ABO/RH(D): B POS
Antibody Screen: NEGATIVE

## 2017-06-20 LAB — PROTIME-INR
INR: 0.9
PROTHROMBIN TIME: 12.1 s (ref 11.4–15.2)

## 2017-06-20 LAB — APTT: APTT: 33 s (ref 24–36)

## 2017-06-20 LAB — GLUCOSE, CAPILLARY: Glucose-Capillary: 101 mg/dL — ABNORMAL HIGH (ref 65–99)

## 2017-06-20 LAB — ABO/RH: ABO/RH(D): B POS

## 2017-06-20 MED ORDER — ONDANSETRON HCL 4 MG/2ML IJ SOLN: 4.0000 mg | Freq: Four times a day (QID) | INTRAMUSCULAR | Status: DC | PRN

## 2017-06-20 MED ORDER — CARBOXYMETHYLCELLULOSE SODIUM 0.5 % OP SOLN: 1.0000 [drp] | Freq: Three times a day (TID) | OPHTHALMIC | Status: DC | PRN

## 2017-06-20 MED ORDER — DONEPEZIL HCL 10 MG PO TABS
10.0000 mg | ORAL_TABLET | Freq: Every day | ORAL | Status: DC
  Administered 2017-06-20 – 2017-06-24 (×5): 10 mg via ORAL
  Filled 2017-06-20 (×5): qty 1

## 2017-06-20 MED ORDER — SERTRALINE HCL 25 MG PO TABS
37.5000 mg | ORAL_TABLET | Freq: Every day | ORAL | Status: DC
  Administered 2017-06-22 – 2017-06-24 (×3): 37.5 mg via ORAL
  Administered 2017-06-25: 25 mg via ORAL
  Filled 2017-06-20 (×3): qty 2

## 2017-06-20 MED ORDER — CALCIUM CARBONATE-VITAMIN D 500-200 MG-UNIT PO TABS
1.0000 | ORAL_TABLET | Freq: Two times a day (BID) | ORAL | Status: DC
  Administered 2017-06-20 – 2017-06-25 (×9): 1 via ORAL
  Filled 2017-06-20 (×9): qty 1

## 2017-06-20 MED ORDER — POLYETHYLENE GLYCOL 3350 17 G PO PACK
17.0000 g | PACK | Freq: Every day | ORAL | Status: DC | PRN
  Administered 2017-06-25: 17 g via ORAL

## 2017-06-20 MED ORDER — SORBITOL 70 % SOLN
30.0000 mL | Freq: Every day | Status: DC | PRN
  Filled 2017-06-20: qty 30

## 2017-06-20 MED ORDER — ACETAMINOPHEN 325 MG PO TABS
650.0000 mg | ORAL_TABLET | Freq: Four times a day (QID) | ORAL | Status: DC | PRN
  Administered 2017-06-21: 650 mg via ORAL
  Filled 2017-06-20 (×2): qty 2

## 2017-06-20 MED ORDER — ENSURE ENLIVE PO LIQD
237.0000 mL | Freq: Three times a day (TID) | ORAL | Status: DC
  Administered 2017-06-22 – 2017-06-24 (×3): 237 mL via ORAL

## 2017-06-20 MED ORDER — POLYVINYL ALCOHOL 1.4 % OP SOLN
1.0000 [drp] | Freq: Three times a day (TID) | OPHTHALMIC | Status: DC | PRN
  Filled 2017-06-20: qty 15

## 2017-06-20 MED ORDER — ENOXAPARIN SODIUM 40 MG/0.4ML ~~LOC~~ SOLN
40.0000 mg | SUBCUTANEOUS | Status: DC
  Administered 2017-06-20: 22:00:00 40 mg via SUBCUTANEOUS
  Filled 2017-06-20: qty 0.4

## 2017-06-20 MED ORDER — ONDANSETRON HCL 4 MG/2ML IJ SOLN
4.0000 mg | Freq: Once | INTRAMUSCULAR | Status: AC
  Administered 2017-06-20: 4 mg via INTRAVENOUS
  Filled 2017-06-20: qty 2

## 2017-06-20 MED ORDER — ACETAMINOPHEN 650 MG RE SUPP: 650.0000 mg | Freq: Four times a day (QID) | RECTAL | Status: DC | PRN

## 2017-06-20 MED ORDER — MOMETASONE FURO-FORMOTEROL FUM 200-5 MCG/ACT IN AERO
2.0000 | INHALATION_SPRAY | Freq: Two times a day (BID) | RESPIRATORY_TRACT | Status: DC
  Administered 2017-06-20 – 2017-06-25 (×9): 2 via RESPIRATORY_TRACT
  Filled 2017-06-20: qty 8.8

## 2017-06-20 MED ORDER — ONDANSETRON HCL 4 MG PO TABS: 4.0000 mg | ORAL_TABLET | Freq: Four times a day (QID) | ORAL | Status: DC | PRN

## 2017-06-20 MED ORDER — LORAZEPAM 0.5 MG PO TABS
0.2500 mg | ORAL_TABLET | Freq: Two times a day (BID) | ORAL | Status: DC
  Administered 2017-06-20 – 2017-06-25 (×10): 0.25 mg via ORAL
  Filled 2017-06-20 (×9): qty 1

## 2017-06-20 MED ORDER — INSULIN ASPART 100 UNIT/ML ~~LOC~~ SOLN: 0.0000 [IU] | Freq: Three times a day (TID) | SUBCUTANEOUS | Status: DC

## 2017-06-20 MED ORDER — NUTRITIONAL SHAKE PO LIQD: 1.0000 | Freq: Three times a day (TID) | ORAL | Status: DC

## 2017-06-20 MED ORDER — SODIUM CHLORIDE 0.9 % IV SOLN
INTRAVENOUS | Status: DC
  Administered 2017-06-20 – 2017-06-23 (×5): via INTRAVENOUS

## 2017-06-20 MED ORDER — HYDROCODONE-ACETAMINOPHEN 5-325 MG PO TABS
1.0000 | ORAL_TABLET | ORAL | Status: DC | PRN
  Administered 2017-06-20 – 2017-06-21 (×3): 1 via ORAL
  Administered 2017-06-21 – 2017-06-25 (×18): 2 via ORAL
  Filled 2017-06-20 (×5): qty 2
  Filled 2017-06-20 (×2): qty 1
  Filled 2017-06-20 (×11): qty 2
  Filled 2017-06-20: qty 1

## 2017-06-20 MED ORDER — DOCUSATE SODIUM 100 MG PO CAPS
100.0000 mg | ORAL_CAPSULE | Freq: Every day | ORAL | Status: DC
  Administered 2017-06-22 – 2017-06-25 (×3): 100 mg via ORAL
  Filled 2017-06-20 (×3): qty 1

## 2017-06-20 NOTE — H&P (View-Only) (Signed)
Colleen Aguilar is an 72 y.o. female.    Chief Complaint: left "leg" hip pain  HPI: 72 y/o female, resident of SNF, sustained a fall 3 days ago getting up from a table at a restaurant. Pt fell onto left side and c/o left "leg" pain. Pt localizes her pain to anterior and lateral femur. Pt continued to walk on the leg for the past 3 days. X-rays at the facility show a femoral neck fracture. Denies any LOC or other injuries s/p this injury. Otherwise alert and appropriate with pain under adequate control. Pt on eliquis for anticoagulation.   PCP:  Madelin Headings, MD  PMH: Past Medical History:  Diagnosis Date  . Asthma    ? dx  clinical dx unable to do pfts response to inhalers  . Diabetes mellitus   . Dysphagia    psychogenic  . Hiatal hernia    egd 2007  . History of colonoscopy    2003  . Mental retardation    special needs lives with mom    PSes:  No Known Allergies  Medications: Current Facility-Administered Medications  Medication Dose Route Frequency Provider Last Rate Last Dose  . 0.9 %  sodium chloride infusion   Intravenous Continuous Melene Plan, DO      . ondansetron [facility]) injection 4 mg  4 mg Intravenous Once Melene Plan, DO       Current Outpatient Medications  Medication Sig Dispense Refill  . acetaminophen (TYLENOL) 325 MG tablet Take 650 mg by mouth every 6 (six) hours as needed. For pain    . albuterol (VENTOLIN HFA) 108 (90 BASE) MCG/ACT inhaler Inhale 2 puffs into the lungs every 6 (six) hours as needed. 1 Inhaler 2  . alendronate (FOSAMAX) 35 MG tablet Take 35 mg by mouth every 7 (seven) days. Take with a full glass of water on an empty stomach.    Marland Kitchen apixaban (ELIQUIS) 5 MG TABS tablet Take 5 mg by mouth 2 (two) times daily.    . budesonide-formoterol (SYMBICORT) 160-4.5 MCG/ACT inhaler Inhale 2 puffs into the lungs 2 (two) times daily.    . calcium-vitamin D (OSCAL WITH D) 500-200 MG-UNIT per tablet Take 1 tablet by mouth 2 (two) times daily.    .  carboxymethylcellulose (REFRESH PLUS) 0.5 % SOLN Place 1 drop into both eyes 3 (three) times daily as needed.    . docusate sodium (COLACE) 100 MG capsule Take 100 mg by mouth daily.    Marland Kitchen donepezil (ARICEPT) 10 MG tablet Take 10 mg by mouth at bedtime.    Marland Kitchen LORazepam (ATIVAN) 0.5 MG tablet Take 0.25 mg by mouth 2 (two) times daily.    . metFORMIN (GLUCOPHAGE) 500 MG tablet Take 250 mg by mouth 2 (two) times daily with a meal.     . Nutritional Supplements (NUTRITIONAL SHAKE PO) Take 1 each by mouth 3 (three) times daily.    . sertraline (ZOLOFT) 25 MG tablet Take 37.5 mg by mouth daily.     . Fluticasone-Salmeterol (ADVAIR DISKUS) 100-50 MCG/DOSE AEPB Inhale 1 puff into the lungs every 12 (twelve) hours. (Patient not taking: Reported on 06/20/2017) 60 each 5    No results found for this or any previous visit (from the past 48 hour(s)). Dg Chest 2 View  Result Date: 06/20/2017 CLINICAL DATA:  Left hip fracture. EXAM: CHEST - 2 VIEW COMPARISON:  08/04/2011 and 12/31/2010. FINDINGS: The heart size and mediastinal contours are stable. The lungs are clear. There is no pleural  effusion or pneumothorax. No acute fractures are identified. There is a stable thoracolumbar scoliosis. IMPRESSION: No active cardiopulmonary process.  Scoliosis. Electronically Signed   By: Carey BullocksWilliam  Veazey M.D.   On: 06/20/2017 13:29   Dg Hip Unilat W Or Wo Pelvis 2-3 Views Left  Result Date: 06/20/2017 CLINICAL DATA:  Left hip pain since falling 4 days ago. EXAM: DG HIP (WITH OR WITHOUT PELVIS) 2-3V LEFT COMPARISON:  None. FINDINGS: The bones are demineralized. There is an impacted subcapital fracture of the left femoral neck. No other fractures are demonstrated. The femoral heads are located. Mild degenerative changes are present in the lower lumbar spine. IMPRESSION: Impacted subcapital fracture of the left femoral neck. Electronically Signed   By: Carey BullocksWilliam  Veazey M.D.   On: 06/20/2017 13:28    ROS: ROS Painful ambulation in  left leg/hip  Physical Exam: Alert and appropriate 72 y/o female in no acute distress Cervical spine with full rom, no tenderness or deformity Bilateral upper extremities with full rom, no tenderness or deformity nv intact distally No rashes or edema Hip painful on left side with movement or compression Mild shortening but no appreciable external rotation nv intact distally No rashes or edema distally  Physical Exam   Assessment/Plan Assessment: left femoral neck fracture  Plan: Pt has an acute femoral neck fracture from a fall 3 days ago Currently on eliquis and will need sufficient time to allow for surgery to be performed safely, probably 1-2 days Pain management as needed Non weight bearing left lower extremity Will continue to monitor her progress  Alphonsa OverallBrad Annistyn Depass PA-C, MPAS [facility] now [facility]  [place] [address], [place], [place] [address] Phone: [phone] [email or web address] Facebook  [employer]  LinkedIn  Twitter

## 2017-06-20 NOTE — ED Notes (Signed)
Bed: WHALD Expected date:  Expected time:  Means of arrival:  Comments: 

## 2017-06-20 NOTE — ED Provider Notes (Signed)
[facility]EMERGENCY DEPT Provider Note   CSN: [id number] Arrival date & time: 06/20/17  1235     History   Chief Complaint Left hip pain Fall  HPI Colleen Aguilar is a 72 y.o. female.  73 yo F with a chief complaint of a fall.  This happened about 4 days ago.  The patient missed a step when she was in a restaurant.  Landed on her left side.  Been complaining of left hip pain.  Denies head injury or loss consciousness denies neck pain back pain chest pain abdominal pain.  Patient has been walking but with pain.  X-ray was performed yesterday and it was found to have a femoral neck fracture.  She was then sent to the emergency department.   The history is provided by the patient.  Injury  This is a new problem. The current episode started yesterday. The problem occurs constantly. The problem has not changed since onset.Pertinent negatives include no chest pain, no headaches and no shortness of breath. Nothing aggravates the symptoms. Nothing relieves the symptoms. She has tried nothing for the symptoms. The treatment provided no relief.    Past Medical History:  Diagnosis Date  . Asthma    ? dx  clinical dx unable to do pfts response to inhalers  . Diabetes mellitus   . Dysphagia    psychogenic  . Hiatal hernia    egd 2007  . History of colonoscopy    2003  . Mental retardation    special needs lives with mom    Patient Active Problem List   Diagnosis Date Noted  . Need for lipid screening 08/12/2010  . Underweight 08/12/2010  . Chronic headaches 08/12/2010  . 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 FLATULENCE ERUCTATION AND GAS PAIN 02/03/2009  . DIARRHEA 02/03/2009  . ADVERSE REACTION TO MEDICATION 02/03/2009  . HEADACHE 10/13/2008  . URINARY INCONTINENCE 10/13/2008  . CERUMEN IMPACTION 09/21/2008  . HEMATURIA UNSPECIFIED 09/21/2008  . OTHER ANXIETY STATES 03/16/2008  . ELBOW INJURY 03/16/2008  . UNSPECIFIED MENTAL RETARDATION 11/15/2007  . CHEST  WALL PAIN, ANTERIOR 11/15/2007  . ASTHMA 12/04/2006    Past Surgical History:  Procedure Laterality Date  . ABDOMINAL HYSTERECTOMY    . ESOPHAGOGASTRODUODENOSCOPY  2007    OB History    No data available       Home Medications    Prior to Admission medications   Medication Sig Start Date End Date Taking? Authorizing Provider  acetaminophen (TYLENOL) 325 MG tablet Take 650 mg by mouth every 6 (six) hours as needed. For pain   Yes [provider]  albuterol (VENTOLIN HFA) 108 (90 BASE) MCG/ACT inhaler Inhale 2 puffs into the lungs every 6 (six) hours as needed. 12/27/10  Yes Panosh, Neta Mends, MD  alendronate (FOSAMAX) 35 MG tablet Take 35 mg by mouth every 7 (seven) days. Take with a full glass of water on an empty stomach.   Yes [provider]  apixaban (ELIQUIS) 5 MG TABS tablet Take 5 mg by mouth 2 (two) times daily.   Yes [provider]  budesonide-formoterol (SYMBICORT) 160-4.5 MCG/ACT inhaler Inhale 2 puffs into the lungs 2 (two) times daily.   Yes [provider]  calcium-vitamin D (OSCAL WITH D) 500-200 MG-UNIT per tablet Take 1 tablet by mouth 2 (two) times daily.   Yes [provider]  carboxymethylcellulose (REFRESH PLUS) 0.5 % SOLN Place 1 drop into both eyes 3 (three) times daily as needed.   Yes  [provider]  docusate sodium (COLACE) 100 MG capsule Take 100 mg by mouth daily.   Yes [provider]  donepezil (ARICEPT) 10 MG tablet Take 10 mg by mouth at bedtime.   Yes [provider]  LORazepam (ATIVAN) 0.5 MG tablet Take 0.25 mg by mouth 2 (two) times daily.   Yes [provider]  metFORMIN (GLUCOPHAGE) 500 MG tablet Take 250 mg by mouth 2 (two) times daily with a meal.    Yes [provider]  Nutritional Supplements (NUTRITIONAL SHAKE PO) Take 1 each by mouth 3 (three) times daily.   Yes [provider]  sertraline (ZOLOFT) 25 MG tablet Take 37.5 mg by mouth daily.     Yes [provider]  Fluticasone-Salmeterol (ADVAIR DISKUS) 100-50 MCG/DOSE AEPB Inhale 1 puff into the lungs every 12 (twelve) hours. Patient not taking: Reported on 06/20/2017 08/19/10   Panosh, Neta MendsWanda K, MD    Family History Family History  Problem Relation Age of Onset  . Diabetes Mother   . Hyperlipidemia Mother   . Other Sister        neurologic disease died from ? prion disease    Social History Social History   Tobacco Use  . Smoking status: Never Smoker  Substance Use Topics  . Alcohol use: No  . Drug use: No     Allergies   Patient has no known allergies.   Review of Systems Review of Systems  Constitutional: Negative for chills and fever.  HENT: Negative for congestion and rhinorrhea.   Eyes: Negative for redness and visual disturbance.  Respiratory: Negative for shortness of breath and wheezing.   Cardiovascular: Negative for chest pain and palpitations.  Gastrointestinal: Negative for nausea and vomiting.  Genitourinary: Negative for dysuria and urgency.  Musculoskeletal: Positive for arthralgias. Negative for myalgias.  Skin: Negative for pallor and wound.  Neurological: Negative for dizziness and headaches.     Physical Exam Updated Vital Signs BP 130/75   Pulse 66   Temp 97.8 F (36.6 C) (Oral)   Resp 18   SpO2 100%   Physical Exam  Constitutional: She is oriented to person, place, and time. She appears well-developed and well-nourished. No distress.  HENT:  Head: Normocephalic and atraumatic.  Eyes: EOM are normal. Pupils are equal, round, and reactive to light.  Neck: Normal range of motion. Neck supple.  Cardiovascular: Normal rate and regular rhythm. Exam reveals no gallop and no friction rub.  No murmur heard. Pulmonary/Chest: Effort normal. She has no wheezes. She has no rales.  Abdominal: Soft. She exhibits no distension and no mass. There is no tenderness. There is no guarding.  Musculoskeletal: She exhibits no edema or  tenderness.  Neurological: She is alert and oriented to person, place, and time.  Skin: Skin is warm and dry. She is not diaphoretic.  Psychiatric: She has a normal mood and affect. Her behavior is normal.  Nursing note and vitals reviewed.    ED Treatments / Results  Labs (all labs ordered are listed, but only abnormal results are displayed) Labs Reviewed  BASIC METABOLIC PANEL - Abnormal; Notable for the following components:      Result Value   Glucose, Bld 117 (*)    All other components within normal limits  CBC WITH DIFFERENTIAL/PLATELET - Abnormal; Notable for the following components:   Platelets 428 (*)    All other components within normal limits  URINALYSIS, ROUTINE W REFLEX MICROSCOPIC - Abnormal; Notable for the following components:  Color, Urine STRAW (*)    Specific Gravity, Urine 1.003 (*)    pH 9.0 (*)    Hgb urine dipstick SMALL (*)    Bacteria, UA RARE (*)    Squamous Epithelial / LPF 0-5 (*)    All other components within normal limits  PROTIME-INR  APTT  TYPE AND SCREEN  ABO/RH    EKG  EKG Interpretation  Date/Time:  Wednesday June 20 2017 13:40:41 EST Ventricular Rate:  58 PR Interval:  154 QRS Duration: 106 QT Interval:  450 QTC Calculation: 441 R Axis:   -55 Text Interpretation:  Sinus bradycardia Left axis deviation Incomplete right bundle branch block Abnormal ECG No significant change since last tracing Confirmed by Melene Plan [phone]) on 06/20/2017 2:08:50 PM       Radiology Dg Chest 2 View  Result Date: 06/20/2017 CLINICAL DATA:  Left hip fracture. EXAM: CHEST - 2 VIEW COMPARISON:  08/04/2011 and 12/31/2010. FINDINGS: The heart size and mediastinal contours are stable. The lungs are clear. There is no pleural effusion or pneumothorax. No acute fractures are identified. There is a stable thoracolumbar scoliosis. IMPRESSION: No active cardiopulmonary process.  Scoliosis. Electronically Signed   By: Carey Bullocks M.D.   On: 06/20/2017  13:29   Dg Hip Unilat W Or Wo Pelvis 2-3 Views Left  Result Date: 06/20/2017 CLINICAL DATA:  Left hip pain since falling 4 days ago. EXAM: DG HIP (WITH OR WITHOUT PELVIS) 2-3V LEFT COMPARISON:  None. FINDINGS: The bones are demineralized. There is an impacted subcapital fracture of the left femoral neck. No other fractures are demonstrated. The femoral heads are located. Mild degenerative changes are present in the lower lumbar spine. IMPRESSION: Impacted subcapital fracture of the left femoral neck. Electronically Signed   By: Carey Bullocks M.D.   On: 06/20/2017 13:28    Procedures Procedures (including critical care time)  Medications Ordered in ED Medications  0.9 %  sodium chloride infusion (not administered)  ondansetron (ZOFRAN) injection 4 mg (not administered)     Initial Impression / Assessment and Plan / ED Course  I have reviewed the triage vital signs and the nursing notes.  Pertinent labs & imaging results that were available during my care of the patient were reviewed by me and considered in my medical decision making (see chart for details).     72 yo F with a chief complaint of a fall.  She was found to have a left hip fracture on outside films.  Will obtain labs x-ray of the hip chest x-ray.  Will discuss with ortho, Dr. Ranell Patrick.  Will come and eval the patient.    Awaiting labs. Discuss with hospitalist for admission.   The patients results and plan were reviewed and discussed.   Any x-rays performed were independently reviewed by myself.   Differential diagnosis were considered with the presenting HPI.  Medications  0.9 %  sodium chloride infusion (not administered)  ondansetron (ZOFRAN) injection 4 mg (not administered)    Vitals:   06/20/17 1241 06/20/17 1306 06/20/17 1537  BP:  131/78 130/75  Pulse:  (!) 58 66  Resp:  18 18  Temp:  97.8 F (36.6 C)   TempSrc:  Oral   SpO2: 98% 100% 100%    Final diagnoses:  Left displaced femoral neck fracture  (HCC)    Admission/ observation were discussed with the admitting physician, patient and/or family and they are comfortable with the plan.    Final Clinical Impressions(s) / ED Diagnoses  Final diagnoses:  Left displaced femoral neck fracture [facility])    ED Discharge Orders    None       Melene Plan, DO 06/20/17 1601

## 2017-06-20 NOTE — Consult Note (Signed)
 Colleen Aguilar is an 72 y.o. female.    Chief Complaint: left "leg" hip pain  HPI: 72 y/o female, resident of SNF, sustained a fall 3 days ago getting up from a table at a restaurant. Pt fell onto left side and c/o left "leg" pain. Pt localizes her pain to anterior and lateral femur. Pt continued to walk on the leg for the past 3 days. X-rays at the facility show a femoral neck fracture. Denies any LOC or other injuries s/p this injury. Otherwise alert and appropriate with pain under adequate control. Pt on eliquis for anticoagulation.   PCP:  Panosh, Wanda K, MD  PMH: Past Medical History:  Diagnosis Date  . Asthma    ? dx  clinical dx unable to do pfts response to inhalers  . Diabetes mellitus   . Dysphagia    psychogenic  . Hiatal hernia    egd 2007  . History of colonoscopy    2003  . Mental retardation    special needs lives with mom    PSes:  No Known Allergies  Medications: Current Facility-Administered Medications  Medication Dose Route Frequency Provider Last Rate Last Dose  . 0.9 %  sodium chloride infusion   Intravenous Continuous Floyd, Dan, DO      . ondansetron (ZOFRAN) injection 4 mg  4 mg Intravenous Once Floyd, Dan, DO       Current Outpatient Medications  Medication Sig Dispense Refill  . acetaminophen (TYLENOL) 325 MG tablet Take 650 mg by mouth every 6 (six) hours as needed. For pain    . albuterol (VENTOLIN HFA) 108 (90 BASE) MCG/ACT inhaler Inhale 2 puffs into the lungs every 6 (six) hours as needed. 1 Inhaler 2  . alendronate (FOSAMAX) 35 MG tablet Take 35 mg by mouth every 7 (seven) days. Take with a full glass of water on an empty stomach.    . apixaban (ELIQUIS) 5 MG TABS tablet Take 5 mg by mouth 2 (two) times daily.    . budesonide-formoterol (SYMBICORT) 160-4.5 MCG/ACT inhaler Inhale 2 puffs into the lungs 2 (two) times daily.    . calcium-vitamin D (OSCAL WITH D) 500-200 MG-UNIT per tablet Take 1 tablet by mouth 2 (two) times daily.    .  carboxymethylcellulose (REFRESH PLUS) 0.5 % SOLN Place 1 drop into both eyes 3 (three) times daily as needed.    . docusate sodium (COLACE) 100 MG capsule Take 100 mg by mouth daily.    . donepezil (ARICEPT) 10 MG tablet Take 10 mg by mouth at bedtime.    . LORazepam (ATIVAN) 0.5 MG tablet Take 0.25 mg by mouth 2 (two) times daily.    . metFORMIN (GLUCOPHAGE) 500 MG tablet Take 250 mg by mouth 2 (two) times daily with a meal.     . Nutritional Supplements (NUTRITIONAL SHAKE PO) Take 1 each by mouth 3 (three) times daily.    . sertraline (ZOLOFT) 25 MG tablet Take 37.5 mg by mouth daily.     . Fluticasone-Salmeterol (ADVAIR DISKUS) 100-50 MCG/DOSE AEPB Inhale 1 puff into the lungs every 12 (twelve) hours. (Patient not taking: Reported on 06/20/2017) 60 each 5    No results found for this or any previous visit (from the past 48 hour(s)). Dg Chest 2 View  Result Date: 06/20/2017 CLINICAL DATA:  Left hip fracture. EXAM: CHEST - 2 VIEW COMPARISON:  08/04/2011 and 12/31/2010. FINDINGS: The heart size and mediastinal contours are stable. The lungs are clear. There is no pleural   effusion or pneumothorax. No acute fractures are identified. There is a stable thoracolumbar scoliosis. IMPRESSION: No active cardiopulmonary process.  Scoliosis. Electronically Signed   By: William  Veazey M.D.   On: 06/20/2017 13:29   Dg Hip Unilat W Or Wo Pelvis 2-3 Views Left  Result Date: 06/20/2017 CLINICAL DATA:  Left hip pain since falling 4 days ago. EXAM: DG HIP (WITH OR WITHOUT PELVIS) 2-3V LEFT COMPARISON:  None. FINDINGS: The bones are demineralized. There is an impacted subcapital fracture of the left femoral neck. No other fractures are demonstrated. The femoral heads are located. Mild degenerative changes are present in the lower lumbar spine. IMPRESSION: Impacted subcapital fracture of the left femoral neck. Electronically Signed   By: William  Veazey M.D.   On: 06/20/2017 13:28    ROS: ROS Painful ambulation in  left leg/hip  Physical Exam: Alert and appropriate 72 y/o female in no acute distress Cervical spine with full rom, no tenderness or deformity Bilateral upper extremities with full rom, no tenderness or deformity nv intact distally No rashes or edema Hip painful on left side with movement or compression Mild shortening but no appreciable external rotation nv intact distally No rashes or edema distally  Physical Exam   Assessment/Plan Assessment: left femoral neck fracture  Plan: Pt has an acute femoral neck fracture from a fall 3 days ago Currently on eliquis and will need sufficient time to allow for surgery to be performed safely, probably 1-2 days Pain management as needed Non weight bearing left lower extremity Will continue to monitor her progress  Brad Dixon PA-C, MPAS [facility] now [facility]  [place] [address], [place], [place] [address] Phone: [phone] [email or web address] Facebook  Instagram  LinkedIn  Twitter     

## 2017-06-20 NOTE — ED Notes (Signed)
Bed: WA17 Expected date:  Expected time:  Means of arrival:  Comments: [facility]

## 2017-06-20 NOTE — H&P (Signed)
History and Physical    Colleen Aguilar WUJ:[id number] DOB: [birth year] DOA: 06/20/2017  PCP: Colleen Headings, MD   Patient coming from: Skilled nursing facility   Chief Complaint: Fall and femoral neck fracture  HPI: Colleen Aguilar is a 72 y.o. female with medical history significant of intellectual disability, asthma, psychogenic dysphagia, type 2 diabetes on oral medications, possible dementia, osteoporosis and unclear apixaban use who comes in after mechanical fall found to have impacted subcapital left femoral neck fracture.  Patient is an unreliable historian due to her underlying electrode disability.  When asked she says that she was at dinner and tripped over a step and had a mechanical fall.  Per the notes and documentation of the chart patient was noted to have a fall approximately 4 days ago and noted to be limping by nursing staff.  An x-ray was taken and showed this fracture.  Patient denies any chest pain, palpitations, nausea, vomiting, diarrhea.  However the review of systems is unclear as she reports in her medical history a diagnosis of cancer and none can be found.  ED Course: In the ED vital signs were stable.  X-ray showed impacted subcapital fracture of the left femoral neck.  Orthopedics was consulted  Review of Systems: She cannot participate in review of systems due to underlying intellectual disability.   Past Medical History:  Diagnosis Date  . Asthma    ? dx  clinical dx unable to do pfts response to inhalers  . Diabetes mellitus   . Dysphagia    psychogenic  . Hiatal hernia    egd 2007  . History of colonoscopy    2003  . Mental retardation    special needs lives with mom    Past Surgical History:  Procedure Laterality Date  . ABDOMINAL HYSTERECTOMY    . ESOPHAGOGASTRODUODENOSCOPY  2007     reports that  has never smoked. she has never used smokeless tobacco. She reports that she does not drink alcohol or use drugs.  No Known Allergies  Family History    Problem Relation Age of Onset  . Diabetes Mother   . Hyperlipidemia Mother   . Other Sister        neurologic disease died from ? prion disease   Unacceptable: Noncontributory, unremarkable, or negative. Acceptable: Family history reviewed and not pertinent (If you reviewed it)  Prior to Admission medications   Medication Sig Start Date End Date Taking? Authorizing Provider  acetaminophen (TYLENOL) 325 MG tablet Take 650 mg by mouth every 6 (six) hours as needed. For pain   Yes [provider]  albuterol (VENTOLIN HFA) 108 (90 BASE) MCG/ACT inhaler Inhale 2 puffs into the lungs every 6 (six) hours as needed. 12/27/10  Yes Colleen Aguilar, Colleen Mends, MD  alendronate (FOSAMAX) 35 MG tablet Take 35 mg by mouth every 7 (seven) days. Take with a full glass of water on an empty stomach.   Yes [provider]  apixaban (ELIQUIS) 5 MG TABS tablet Take 5 mg by mouth 2 (two) times daily.   Yes [provider]  budesonide-formoterol (SYMBICORT) 160-4.5 MCG/ACT inhaler Inhale 2 puffs into the lungs 2 (two) times daily.   Yes [provider]  calcium-vitamin D (OSCAL WITH D) 500-200 MG-UNIT per tablet Take 1 tablet by mouth 2 (two) times daily.   Yes [provider]  carboxymethylcellulose (REFRESH PLUS) 0.5 % SOLN Place 1 drop into both eyes 3 (three) times daily as needed.   Yes [provider]  docusate sodium (COLACE) 100 MG capsule Take 100 mg by mouth daily.   Yes [provider]  donepezil (ARICEPT) 10 MG tablet Take 10 mg by mouth at bedtime.   Yes [provider]  LORazepam (ATIVAN) 0.5 MG tablet Take 0.25 mg by mouth 2 (two) times daily.   Yes [provider]  metFORMIN (GLUCOPHAGE) 500 MG tablet Take 250 mg by mouth 2 (two) times daily with a meal.    Yes [provider]  Nutritional Supplements (NUTRITIONAL SHAKE PO) Take 1 each by mouth 3 (three) times daily.   Yes [provider]  sertraline (ZOLOFT) 25  MG tablet Take 37.5 mg by mouth daily.    Yes [provider]  Fluticasone-Salmeterol (ADVAIR DISKUS) 100-50 MCG/DOSE AEPB Inhale 1 puff into the lungs every 12 (twelve) hours. Patient not taking: Reported on 06/20/2017 08/19/10   Colleen Aguilar, Colleen K, MD    Physical Exam: Vitals:   06/20/17 1241 06/20/17 1306 06/20/17 1537  BP:  131/78 130/75  Pulse:  (!) 58 66  Resp:  18 18  Temp:  97.8 F (36.6 C)   TempSrc:  Oral   SpO2: 98% 100% 100%    Constitutional: NAD, calm, comfortable Vitals:   06/20/17 1241 06/20/17 1306 06/20/17 1537  BP:  131/78 130/75  Pulse:  (!) 58 66  Resp:  18 18  Temp:  97.8 F (36.6 C)   TempSrc:  Oral   SpO2: 98% 100% 100%   Eyes: Anicteric sclera ENMT: Dry mucous membranes, poor dentition Neck: normal, supple Respiratory: clear to auscultation bilaterally, no wheezing, no crackles. Normal respiratory effort. No accessory muscle use.  Cardiovascular: Regular rate and rhythm, no murmurs Abdomen: no tenderness, no masses palpated. No hepatosplenomegaly. Bowel sounds positive.  Musculoskeletal: No lower extremity edema, pain on flexion or extension or medial or lateral rotation of left foot, intact DP and PT pulses, intact popliteal pulse, intact sensation Skin: no rashes Neurologic: Grossly intact moving all extremities except left lower extremity due to pain Psychiatric: Confabulates when asked questions, alert to self and situation but not to time \ Labs on Admission: I have personally reviewed following labs and imaging studies  CBC: Recent Labs  Lab 06/20/17 1439  WBC 6.4  NEUTROABS 5.0  HGB 12.2  HCT 37.0  MCV 95.4  PLT 428*   Basic Metabolic Panel: Recent Labs  Lab 06/20/17 1439  NA 139  Aguilar 3.7  CL 101  CO2 28  GLUCOSE 117*  BUN 10  CREATININE 0.50  CALCIUM 9.4   GFR: CrCl cannot be calculated (Unknown ideal weight.). Liver Function Tests: No results for input(s): AST, ALT, ALKPHOS, BILITOT, PROT, ALBUMIN in the last 168  hours. No results for input(s): LIPASE, AMYLASE in the last 168 hours. No results for input(s): AMMONIA in the last 168 hours. Coagulation Profile: Recent Labs  Lab 06/20/17 1439  INR 0.90   Cardiac Enzymes: No results for input(s): CKTOTAL, CKMB, CKMBINDEX, TROPONINI in the last 168 hours. BNP (last 3 results) No results for input(s): PROBNP in the last 8760 hours. HbA1C: No results for input(s): HGBA1C in the last 72 hours. CBG: No results for input(s): GLUCAP in the last 168 hours. Lipid Profile: No results for input(s): CHOL, HDL, LDLCALC, TRIG, CHOLHDL, LDLDIRECT in the last 72 hours. Thyroid Function Tests: No results for input(s): TSH, T4TOTAL, FREET4, T3FREE, THYROIDAB in the last 72 hours. Anemia Panel: No results for input(s): VITAMINB12, FOLATE, FERRITIN, TIBC, IRON, RETICCTPCT in the last 72  hours. Urine analysis:    Component Value Date/Time   COLORURINE STRAW (A) 06/20/2017 1439   APPEARANCEUR CLEAR 06/20/2017 1439   LABSPEC 1.003 (L) 06/20/2017 1439   PHURINE 9.0 (H) 06/20/2017 1439   GLUCOSEU NEGATIVE 06/20/2017 1439   HGBUR SMALL (A) 06/20/2017 1439   HGBUR small 10/13/2008 1302   BILIRUBINUR NEGATIVE 06/20/2017 1439   BILIRUBINUR n 08/12/2010   KETONESUR NEGATIVE 06/20/2017 1439   PROTEINUR NEGATIVE 06/20/2017 1439   UROBILINOGEN 0.2 08/04/2011 1414   NITRITE NEGATIVE 06/20/2017 1439   LEUKOCYTESUR NEGATIVE 06/20/2017 1439    Radiological Exams on Admission: Dg Chest 2 View  Result Date: 06/20/2017 CLINICAL DATA:  Left hip fracture. EXAM: CHEST - 2 VIEW COMPARISON:  08/04/2011 and 12/31/2010. FINDINGS: The heart size and mediastinal contours are stable. The lungs are clear. There is no pleural effusion or pneumothorax. No acute fractures are identified. There is a stable thoracolumbar scoliosis. IMPRESSION: No active cardiopulmonary process.  Scoliosis. Electronically Signed   By: Carey Bullocks M.D.   On: 06/20/2017 13:29   Dg Hip Unilat W Or Wo  Pelvis 2-3 Views Left  Result Date: 06/20/2017 CLINICAL DATA:  Left hip pain since falling 4 days ago. EXAM: DG HIP (WITH OR WITHOUT PELVIS) 2-3V LEFT COMPARISON:  None. FINDINGS: The bones are demineralized. There is an impacted subcapital fracture of the left femoral neck. No other fractures are demonstrated. The femoral heads are located. Mild degenerative changes are present in the lower lumbar spine. IMPRESSION: Impacted subcapital fracture of the left femoral neck. Electronically Signed   By: Carey Bullocks M.D.   On: 06/20/2017 13:28    EKG: Independently reviewed.  Sinus bradycardia, right bundle branch block  Assessment/Plan Active Problems:   Delay in development   UNSPECIFIED MENTAL RETARDATION   Asthma   #) Subcapital left femoral neck fracture: Mechanical fall -Orthopedics following appreciate recommendations -N.p.o. at midnight -PT ordered - Unclear how consent will be able to be obtained due to her developmental delay -we will consider social work consult   #) Osteoporosis: Patient has alendronate on her dedication list however it is unclear if she is still taking it -Hold alendronate in the setting of acute fracture -Continue calcium vitamin D supplementation  #) Developmental delay/dementia: Per review of the charts there is been some concern the patient has developing dementia however it is difficult to differentiate this from her delay. -Continue donepezil 10 mg nightly  #) Asthma: -Continue long-acting beta agonist/inhaled corticosteroid -PRN albuterol as needed  #) Type 2 diabetes: Only on home oral glycemic's -Sliding scale insulin, before meals at bedtime  #) Psych: -Continue lorazepam 0.25 mg twice daily -Continue sertraline 37.5 mg daily  #) Apixaban usage: Apixaban appears to be on her medication list however she cannot tell me and I cannot find in the chart why she is on this medication.  Regardless it will need to be held in the setting of needing  surgery.  If it were to be used for atrial fibrillation she does not need to be bridged based on her CHADSVASc.  If it is for DVT we will consider starting heparin.  Fluids: IV fluids Elect lites: Monitor and supplement Nutrition: Regular diet, n.p.o. at midnight  Prophylaxis: Enoxaparin  Disposition: Pending surgery and PT   Full code   Delaine Lame MD [facility]  If 7PM-7AM, please contact night-coverage [email or web address] Password [facility]  06/20/2017, 5:16 PM

## 2017-06-20 NOTE — ED Triage Notes (Signed)
Pt from [facility]. She had a fall on this past Saturday when leaving a restaurant. Pt returned the facility on Saturday and has been ambulating with complaints of left leg pain. The facility did an x ray yesterday and discovered that pt had a left femoral neck fracture. Pt has hx of diabetes. Pt AO x3 per EMS.

## 2017-06-21 ENCOUNTER — Inpatient Hospital Stay (HOSPITAL_COMMUNITY): Payer: Medicare Other | Admitting: Registered Nurse

## 2017-06-21 ENCOUNTER — Inpatient Hospital Stay (HOSPITAL_COMMUNITY): Payer: Medicare Other

## 2017-06-21 ENCOUNTER — Encounter (HOSPITAL_COMMUNITY): Payer: Self-pay | Admitting: Registered Nurse

## 2017-06-21 ENCOUNTER — Encounter (HOSPITAL_COMMUNITY)
Admission: EM | Disposition: A | Discharge status: Skilled Nursing Facility | Payer: Self-pay | Source: Home / Self Care | Attending: Internal Medicine

## 2017-06-21 DIAGNOSIS — E119 Type 2 diabetes mellitus without complications: Secondary | ICD-10-CM

## 2017-06-21 DIAGNOSIS — S72002A Fracture of unspecified part of neck of left femur, initial encounter for closed fracture: Secondary | ICD-10-CM | POA: Diagnosis present

## 2017-06-21 DIAGNOSIS — J45909 Unspecified asthma, uncomplicated: Secondary | ICD-10-CM

## 2017-06-21 HISTORY — DX: Type 2 diabetes mellitus without complications: E11.9

## 2017-06-21 HISTORY — PX: TOTAL HIP ARTHROPLASTY: SHX124

## 2017-06-21 LAB — BASIC METABOLIC PANEL
BUN: 9 mg/dL (ref 6–20)
CO2: 24 mmol/L (ref 22–32)
Chloride: 107 mmol/L (ref 101–111)
Creatinine, Ser: 0.51 mg/dL (ref 0.44–1.00)
Glucose, Bld: 95 mg/dL (ref 65–99)
Sodium: 142 mmol/L (ref 135–145)

## 2017-06-21 LAB — IRON AND TIBC
IRON: 46 ug/dL (ref 28–170)
Saturation Ratios: 14 % (ref 10.4–31.8)
TIBC: 319 ug/dL (ref 250–450)
UIBC: 273 ug/dL

## 2017-06-21 LAB — BASIC METABOLIC PANEL WITH GFR
Anion gap: 11 (ref 5–15)
Calcium: 8.5 mg/dL — ABNORMAL LOW (ref 8.9–10.3)
GFR calc Af Amer: >60 mL/min (ref >60)
GFR calc non Af Amer: >60 mL/min (ref >60)
Potassium: 3.4 mmol/L — ABNORMAL LOW (ref 3.5–5.1)

## 2017-06-21 LAB — MAGNESIUM: MAGNESIUM: 1.6 mg/dL — AB (ref 1.7–2.4)

## 2017-06-21 LAB — CBC
HCT: 32.3 % — ABNORMAL LOW (ref 36.0–46.0)
Hemoglobin: 10.6 g/dL — ABNORMAL LOW (ref 12.0–15.0)
MCH: 31.3 pg (ref 26.0–34.0)
MCHC: 32.8 g/dL (ref 30.0–36.0)
MCV: 95.3 fL (ref 78.0–100.0)
Platelets: 376 10*3/uL (ref 150–400)
RBC: 3.39 MIL/uL — ABNORMAL LOW (ref 3.87–5.11)
RDW: 14.1 % (ref 11.5–15.5)
WBC: 6.8 K/uL (ref 4.0–10.5)

## 2017-06-21 LAB — GLUCOSE, CAPILLARY
Glucose-Capillary: 100 mg/dL — ABNORMAL HIGH (ref 65–99)
Glucose-Capillary: 95 mg/dL (ref 65–99)
Glucose-Capillary: 110 mg/dL — ABNORMAL HIGH (ref 65–99)

## 2017-06-21 LAB — FOLATE: FOLATE: 9.4 ng/mL (ref >5.9)

## 2017-06-21 LAB — FERRITIN: Ferritin: 35 ng/mL (ref 11–307)

## 2017-06-21 LAB — VITAMIN B12: VITAMIN B 12: 168 pg/mL — AB (ref 180–914)

## 2017-06-21 LAB — SURGICAL PCR SCREEN
MRSA, PCR: NEGATIVE
Staphylococcus aureus: NEGATIVE

## 2017-06-21 SURGERY — ARTHROPLASTY, HIP, TOTAL, ANTERIOR APPROACH
Anesthesia: General | Laterality: Left

## 2017-06-21 MED ORDER — METOCLOPRAMIDE HCL 5 MG/ML IJ SOLN: 5.0000 mg | Freq: Three times a day (TID) | INTRAMUSCULAR | Status: DC | PRN

## 2017-06-21 MED ORDER — KETOROLAC TROMETHAMINE 30 MG/ML IJ SOLN
INTRAMUSCULAR | Status: AC
  Filled 2017-06-21: qty 1

## 2017-06-21 MED ORDER — DEXAMETHASONE SODIUM PHOSPHATE 10 MG/ML IJ SOLN
INTRAMUSCULAR | Status: AC
  Filled 2017-06-21: qty 1

## 2017-06-21 MED ORDER — PROPOFOL 10 MG/ML IV BOLUS
INTRAVENOUS | Status: DC | PRN
  Administered 2017-06-21: 120 mg via INTRAVENOUS

## 2017-06-21 MED ORDER — PROPOFOL 10 MG/ML IV BOLUS
INTRAVENOUS | Status: AC
  Filled 2017-06-21: qty 60

## 2017-06-21 MED ORDER — FENTANYL CITRATE (PF) 100 MCG/2ML IJ SOLN
INTRAMUSCULAR | Status: AC
  Filled 2017-06-21: qty 2

## 2017-06-21 MED ORDER — SUCCINYLCHOLINE CHLORIDE 200 MG/10ML IV SOSY
PREFILLED_SYRINGE | INTRAVENOUS | Status: AC
  Filled 2017-06-21: qty 10

## 2017-06-21 MED ORDER — LIDOCAINE 2% (20 MG/ML) 5 ML SYRINGE
INTRAMUSCULAR | Status: AC
  Filled 2017-06-21: qty 5

## 2017-06-21 MED ORDER — CEFAZOLIN SODIUM-DEXTROSE 2-4 GM/100ML-% IV SOLN: 2.0000 g | INTRAVENOUS | Status: DC

## 2017-06-21 MED ORDER — BUPIVACAINE HCL (PF) 0.25 % IJ SOLN
INTRAMUSCULAR | Status: DC | PRN
  Administered 2017-06-21: 30 mL

## 2017-06-21 MED ORDER — INSULIN ASPART 100 UNIT/ML ~~LOC~~ SOLN: 0.0000 [IU] | Freq: Once | SUBCUTANEOUS | Status: DC

## 2017-06-21 MED ORDER — SODIUM CHLORIDE 0.9 % IJ SOLN
INTRAMUSCULAR | Status: AC
  Filled 2017-06-21: qty 50

## 2017-06-21 MED ORDER — ACETAMINOPHEN 10 MG/ML IV SOLN
1000.0000 mg | INTRAVENOUS | Status: AC
  Administered 2017-06-21: 1000 mg via INTRAVENOUS

## 2017-06-21 MED ORDER — CHLORHEXIDINE GLUCONATE 4 % EX LIQD
60.0000 mL | Freq: Once | CUTANEOUS | Status: AC
  Administered 2017-06-21: 4 via TOPICAL

## 2017-06-21 MED ORDER — CYANOCOBALAMIN 1000 MCG/ML IJ SOLN
1000.0000 ug | Freq: Every day | INTRAMUSCULAR | Status: DC
  Administered 2017-06-21 – 2017-06-25 (×5): 1000 ug via INTRAMUSCULAR
  Filled 2017-06-21 (×5): qty 1

## 2017-06-21 MED ORDER — KETOROLAC TROMETHAMINE 30 MG/ML IJ SOLN
INTRAMUSCULAR | Status: DC | PRN
  Administered 2017-06-21: 30 mg

## 2017-06-21 MED ORDER — DEXTROSE 5 % IV SOLN
3.0000 g | INTRAVENOUS | Status: AC
  Administered 2017-06-21: 2 g via INTRAVENOUS
  Filled 2017-06-21: qty 3

## 2017-06-21 MED ORDER — FENTANYL CITRATE (PF) 250 MCG/5ML IJ SOLN
INTRAMUSCULAR | Status: DC | PRN
  Administered 2017-06-21: 25 ug via INTRAVENOUS

## 2017-06-21 MED ORDER — FENTANYL CITRATE (PF) 100 MCG/2ML IJ SOLN
INTRAMUSCULAR | Status: DC | PRN
  Administered 2017-06-21 (×2): 50 ug via INTRAVENOUS

## 2017-06-21 MED ORDER — MENTHOL 3 MG MT LOZG: 1.0000 | LOZENGE | OROMUCOSAL | Status: DC | PRN

## 2017-06-21 MED ORDER — DEXAMETHASONE SODIUM PHOSPHATE 10 MG/ML IJ SOLN
INTRAMUSCULAR | Status: DC | PRN
  Administered 2017-06-21: 10 mg via INTRAVENOUS

## 2017-06-21 MED ORDER — ONDANSETRON HCL 4 MG/2ML IJ SOLN
INTRAMUSCULAR | Status: AC
  Filled 2017-06-21: qty 2

## 2017-06-21 MED ORDER — POTASSIUM CHLORIDE CRYS ER 20 MEQ PO TBCR
40.0000 meq | EXTENDED_RELEASE_TABLET | Freq: Once | ORAL | Status: DC
  Filled 2017-06-21: qty 2

## 2017-06-21 MED ORDER — LIDOCAINE 2% (20 MG/ML) 5 ML SYRINGE
INTRAMUSCULAR | Status: DC | PRN
  Administered 2017-06-21: 100 mg via INTRAVENOUS

## 2017-06-21 MED ORDER — WATER FOR IRRIGATION, STERILE IR SOLN
Status: DC | PRN
  Administered 2017-06-21: 2000 mL

## 2017-06-21 MED ORDER — MIDAZOLAM HCL 2 MG/2ML IJ SOLN
INTRAMUSCULAR | Status: AC
  Filled 2017-06-21: qty 2

## 2017-06-21 MED ORDER — INSULIN ASPART 100 UNIT/ML ~~LOC~~ SOLN
0.0000 [IU] | Freq: Four times a day (QID) | SUBCUTANEOUS | Status: DC
  Administered 2017-06-22: 3 [IU] via SUBCUTANEOUS

## 2017-06-21 MED ORDER — PROPOFOL 10 MG/ML IV BOLUS
INTRAVENOUS | Status: AC
  Filled 2017-06-21: qty 20

## 2017-06-21 MED ORDER — SUCCINYLCHOLINE CHLORIDE 200 MG/10ML IV SOSY
PREFILLED_SYRINGE | INTRAVENOUS | Status: DC | PRN
  Administered 2017-06-21: 100 mg via INTRAVENOUS

## 2017-06-21 MED ORDER — ONDANSETRON HCL 4 MG/2ML IJ SOLN
INTRAMUSCULAR | Status: DC | PRN
  Administered 2017-06-21: 4 mg via INTRAVENOUS

## 2017-06-21 MED ORDER — CEFAZOLIN SODIUM-DEXTROSE 2-4 GM/100ML-% IV SOLN
2.0000 g | Freq: Four times a day (QID) | INTRAVENOUS | Status: AC
  Administered 2017-06-21 – 2017-06-22 (×2): 2 g via INTRAVENOUS
  Filled 2017-06-21 (×2): qty 100

## 2017-06-21 MED ORDER — ISOPROPYL ALCOHOL 70 % SOLN
Status: DC | PRN
  Administered 2017-06-21: 1 via TOPICAL

## 2017-06-21 MED ORDER — ROCURONIUM BROMIDE 10 MG/ML (PF) SYRINGE
PREFILLED_SYRINGE | INTRAVENOUS | Status: AC
  Filled 2017-06-21: qty 5

## 2017-06-21 MED ORDER — FENTANYL CITRATE (PF) 250 MCG/5ML IJ SOLN
INTRAMUSCULAR | Status: AC
  Filled 2017-06-21: qty 5

## 2017-06-21 MED ORDER — FENTANYL CITRATE (PF) 100 MCG/2ML IJ SOLN
25.0000 ug | INTRAMUSCULAR | Status: DC | PRN
  Administered 2017-06-21: 50 ug via INTRAVENOUS

## 2017-06-21 MED ORDER — ACETAMINOPHEN 10 MG/ML IV SOLN
INTRAVENOUS | Status: AC
Start: 2017-06-21 — End: 2017-06-21
  Filled 2017-06-21: qty 100

## 2017-06-21 MED ORDER — SODIUM CHLORIDE 0.9 % IJ SOLN
INTRAMUSCULAR | Status: DC | PRN
  Administered 2017-06-21: 30 mL

## 2017-06-21 MED ORDER — SODIUM CHLORIDE 0.9 % IV SOLN
1000.0000 mg | INTRAVENOUS | Status: AC
  Administered 2017-06-21: 1000 mg via INTRAVENOUS
  Filled 2017-06-21: qty 1100

## 2017-06-21 MED ORDER — SENNA 8.6 MG PO TABS
1.0000 | ORAL_TABLET | Freq: Two times a day (BID) | ORAL | Status: DC
  Administered 2017-06-21 – 2017-06-25 (×8): 8.6 mg via ORAL
  Filled 2017-06-21 (×8): qty 1

## 2017-06-21 MED ORDER — MAGNESIUM SULFATE 4 GM/100ML IV SOLN
4.0000 g | Freq: Once | INTRAVENOUS | Status: AC
  Administered 2017-06-21: 4 g via INTRAVENOUS
  Filled 2017-06-21: qty 100

## 2017-06-21 MED ORDER — BUPIVACAINE HCL (PF) 0.25 % IJ SOLN
INTRAMUSCULAR | Status: AC
  Filled 2017-06-21: qty 30

## 2017-06-21 MED ORDER — SODIUM CHLORIDE 0.9 % IR SOLN
Status: DC | PRN
  Administered 2017-06-21: 3000 mL

## 2017-06-21 MED ORDER — SUGAMMADEX SODIUM 200 MG/2ML IV SOLN
INTRAVENOUS | Status: DC | PRN
  Administered 2017-06-21: 75 mg via INTRAVENOUS

## 2017-06-21 MED ORDER — METOCLOPRAMIDE HCL 5 MG PO TABS: 5.0000 mg | ORAL_TABLET | Freq: Three times a day (TID) | ORAL | Status: DC | PRN

## 2017-06-21 MED ORDER — PHENYLEPHRINE HCL 10 MG/ML IJ SOLN
INTRAMUSCULAR | Status: AC
  Filled 2017-06-21: qty 1

## 2017-06-21 MED ORDER — POVIDONE-IODINE 10 % EX SWAB
2.0000 "application " | Freq: Once | CUTANEOUS | Status: AC
  Administered 2017-06-21: 2 via TOPICAL

## 2017-06-21 MED ORDER — SODIUM CHLORIDE 0.9 % IR SOLN
Status: DC | PRN
  Administered 2017-06-21: 1000 mL

## 2017-06-21 MED ORDER — CEFAZOLIN SODIUM-DEXTROSE 2-4 GM/100ML-% IV SOLN
INTRAVENOUS | Status: AC
  Filled 2017-06-21: qty 100

## 2017-06-21 MED ORDER — ROCURONIUM BROMIDE 10 MG/ML (PF) SYRINGE
PREFILLED_SYRINGE | INTRAVENOUS | Status: DC | PRN
  Administered 2017-06-21: 30 mg via INTRAVENOUS

## 2017-06-21 MED ORDER — PHENOL 1.4 % MT LIQD
1.0000 | OROMUCOSAL | Status: DC | PRN
  Filled 2017-06-21: qty 177

## 2017-06-21 MED ORDER — SUGAMMADEX SODIUM 200 MG/2ML IV SOLN
INTRAVENOUS | Status: AC
  Filled 2017-06-21: qty 2

## 2017-06-21 MED ORDER — APIXABAN 2.5 MG PO TABS
2.5000 mg | ORAL_TABLET | Freq: Two times a day (BID) | ORAL | Status: DC
  Administered 2017-06-22 – 2017-06-25 (×7): 2.5 mg via ORAL
  Filled 2017-06-21 (×6): qty 1

## 2017-06-21 SURGICAL SUPPLY — 50 items
ADH SKN CLS APL DERMABOND .7 (GAUZE/BANDAGES/DRESSINGS) ×2
BAG DECANTER FOR FLEXI CONT (MISCELLANEOUS) IMPLANT
BAG SPEC THK2 15X12 ZIP CLS (MISCELLANEOUS) ×1
BAG ZIPLOCK 12X15 (MISCELLANEOUS) ×2 IMPLANT
CAPT HIP TOTAL 2 ×2 IMPLANT
CHLORAPREP W/TINT 26ML (MISCELLANEOUS) ×3 IMPLANT
CLOTH BEACON ORANGE TIMEOUT ST (SAFETY) ×1 IMPLANT
COVER PERINEAL POST (MISCELLANEOUS) ×3 IMPLANT
COVER SURGICAL LIGHT HANDLE (MISCELLANEOUS) ×3 IMPLANT
DECANTER SPIKE VIAL GLASS SM (MISCELLANEOUS) ×5 IMPLANT
DERMABOND ADVANCED (GAUZE/BANDAGES/DRESSINGS) ×4
DERMABOND ADVANCED .7 DNX12 (GAUZE/BANDAGES/DRESSINGS) ×2 IMPLANT
DRAPE SHEET LG 3/4 BI-LAMINATE (DRAPES) ×9 IMPLANT
DRAPE STERI IOBAN 125X83 (DRAPES) ×3 IMPLANT
DRAPE U-SHAPE 47X51 STRL (DRAPES) ×6 IMPLANT
DRSG AQUACEL AG ADV 3.5X10 (GAUZE/BANDAGES/DRESSINGS) ×3 IMPLANT
ELECT PENCIL ROCKER SW 15FT (MISCELLANEOUS) ×3 IMPLANT
ELECT REM PT RETURN 15FT ADLT (MISCELLANEOUS) ×3 IMPLANT
GAUZE SPONGE 4X4 12PLY STRL (GAUZE/BANDAGES/DRESSINGS) ×3 IMPLANT
GLOVE BIO SURGEON STRL SZ8.5 (GLOVE) ×6 IMPLANT
GLOVE BIOGEL PI IND STRL 7.5 (GLOVE) IMPLANT
GLOVE BIOGEL PI IND STRL 8.5 (GLOVE) ×1 IMPLANT
GLOVE BIOGEL PI INDICATOR 7.5 (GLOVE) ×6
GLOVE BIOGEL PI INDICATOR 8.5 (GLOVE) ×2
GLOVE ECLIPSE 8.0 STRL XLNG CF (GLOVE) ×4 IMPLANT
GLOVE SURG SS PI 7.5 STRL IVOR (GLOVE) ×4 IMPLANT
GOWN SPEC L3 XXLG W/TWL (GOWN DISPOSABLE) ×5 IMPLANT
GOWN STRL REUS W/TWL LRG LVL3 (GOWN DISPOSABLE) ×2 IMPLANT
HANDPIECE INTERPULSE COAX TIP (DISPOSABLE) ×3
HOLDER FOLEY CATH W/STRAP (MISCELLANEOUS) ×3 IMPLANT
HOOD PEEL AWAY FLYTE STAYCOOL (MISCELLANEOUS) ×12 IMPLANT
MARKER SKIN DUAL TIP RULER LAB (MISCELLANEOUS) ×3 IMPLANT
NDL SPNL 18GX3.5 QUINCKE PK (NEEDLE) ×1 IMPLANT
NEEDLE SPNL 18GX3.5 QUINCKE PK (NEEDLE) ×3 IMPLANT
PACK ANTERIOR HIP CUSTOM (KITS) ×3 IMPLANT
SAW OSC TIP CART 19.5X105X1.3 (SAW) ×3 IMPLANT
SEALER BIPOLAR AQUA 6.0 (INSTRUMENTS) ×3 IMPLANT
SET HNDPC FAN SPRY TIP SCT (DISPOSABLE) ×1 IMPLANT
SUT ETHIBOND NAB CT1 #1 30IN (SUTURE) ×6 IMPLANT
SUT MNCRL AB 3-0 PS2 18 (SUTURE) ×3 IMPLANT
SUT MON AB 2-0 CT1 36 (SUTURE) ×6 IMPLANT
SUT STRATAFIX PDO 1 14 VIOLET (SUTURE) ×3
SUT STRATFX PDO 1 14 VIOLET (SUTURE) ×1
SUT VIC AB 2-0 CT1 27 (SUTURE) ×3
SUT VIC AB 2-0 CT1 TAPERPNT 27 (SUTURE) ×1 IMPLANT
SUTURE STRATFX PDO 1 14 VIOLET (SUTURE) ×1 IMPLANT
SYR 50ML LL SCALE MARK (SYRINGE) ×3 IMPLANT
TRAY FOLEY CATH 14FRSI W/METER (CATHETERS) ×2 IMPLANT
TRAY FOLEY W/METER SILVER 16FR (SET/KITS/TRAYS/PACK) IMPLANT
YANKAUER SUCT BULB TIP 10FT TU (MISCELLANEOUS) ×3 IMPLANT

## 2017-06-21 NOTE — Progress Notes (Signed)
Physical Therapy Discharge Patient Details Name: Colleen Aguilar MRN: [id number] DOB: 1946-04-07 Today's Date: 06/21/2017 Time:  -     Patient discharged from PT services secondary to surgery - will need to re-order PT to resume therapy services.       Colleen Aguilar, Colleen Aguilar Colleen Aguilar 06/21/2017, 7:21 AM Blanchard KelchKaren Nyeisha Aguilar PT [phone]

## 2017-06-21 NOTE — Clinical Social Work Note (Signed)
Clinical Social Work Assessment  Patient Details  Name: Colleen Aguilar MRN: [id number] Date of Birth: [birth year]  Date of referral:  06/21/17               Reason for consult:  Discharge Planning, Facility Placement                Permission sought to share information with:  Family Supports Permission granted to share information::  Yes, Verbal Permission Granted  Name::     Insurance underwriter::  [name]   Relationship::  Brother   Contact Information:  [phone]  Housing/Transportation Living arrangements for the past 2 months:  [place] of Information:  Patient, Facility Patient Interpreter Needed:  None Criminal Activity/Legal Involvement Pertinent to Current Situation/Hospitalization:  No - Comment as needed Significant Relationships:  Siblings Lives with:  Self Do you feel safe going back to the place where you live?  No Need for family participation in patient care:  Yes (Comment)  Care giving concerns:   Fall and Femoral neck fracture  PT evaluation pending.   Social Worker assessment / plan:  CSW met with patient at bedside, explain role and reason for visit. Patient reports, " I was sitting at the table and got up too fast and fell." Patient reports hip pain. Patient says she has lived at [facility] for eight years. She was able to provide limited history.  CSW called ALF and gathered collateral information and pts. brothers contact information.   CSW spoke to the ALF resident care director. She states the patient can ambulate at the facility without assistance. She can bathe herself w/ limited assistance from staff and feeds herself.  The patient fell four days ago and was found limping by staff.  Patient disposition unknown.   CSW left voicemail for patient brother.   Plan: to be determine  Employment status:  Disabled (Comment on whether or not currently receiving Disability) Insurance information:  Medicare PT  Recommendations:    Information / Referral to community resources:     Patient/Family's Response to care: Unable to assess.   Patient/Family's Understanding of and Emotional Response to Diagnosis, Current Treatment, and Prognosis: Patient intellectually disabled. She does not appear to fully understand her diagnosis and current treatment plan. No family at bedside.   Emotional Assessment Appearance:  Appears stated age Attitude/Demeanor/Rapport:    Affect (typically observed):  Accepting, Calm Orientation:  Oriented to Self, Oriented to Place Alcohol / Substance use:  Not Applicable Psych involvement (Current and /or in the community):  No (Comment)  Discharge Needs  Concerns to be addressed:  Discharge Planning Concerns Readmission within the last 30 days:  No Current discharge risk:    Barriers to Discharge:  Continued Medical Work up   [employer], LCSW 06/21/2017, 12:08 PM

## 2017-06-21 NOTE — Discharge Instructions (Signed)
°Dr. Jerold Yoss °Joint Replacement Specialist °[facility] °[address] °, [place] [address] °[phone] ° ° °TOTAL HIP REPLACEMENT POSTOPERATIVE DIRECTIONS ° ° ° °Hip Rehabilitation, Guidelines Following Surgery  ° °WEIGHT BEARING °Weight bearing as tolerated with assist device (walker, cane, etc) as directed, use it as long as suggested by your surgeon or therapist, typically at least 4-6 weeks. ° °The results of a hip operation are greatly improved after range of motion and muscle strengthening exercises. Follow all safety measures which are given to protect your hip. If any of these exercises cause increased pain or swelling in your joint, decrease the amount until you are comfortable again. Then slowly increase the exercises. Call your caregiver if you have problems or questions.  ° °HOME CARE INSTRUCTIONS  °Most of the following instructions are designed to prevent the dislocation of your new hip.  °Remove items at home which could result in a fall. This includes throw rugs or furniture in walking pathways.  °Continue medications as instructed at time of discharge. °· You may have some home medications which will be placed on hold until you complete the course of blood thinner medication. °· You may start showering once you are discharged home. Do not remove your dressing. °Do not put on socks or shoes without following the instructions of your caregivers.   °Sit on chairs with arms. Use the chair arms to help push yourself up when arising.  °Arrange for the use of a toilet seat elevator so you are not sitting low.  °· Walk with walker as instructed.  °You may resume a sexual relationship in one month or when given the OK by your caregiver.  °Use walker as long as suggested by your caregivers.  °You may put full weight on your legs and walk as much as is comfortable. °Avoid periods of inactivity such as sitting longer than an hour when not asleep. This helps prevent  blood clots.  °You may return to work once you are cleared by your surgeon.  °Do not drive a car for 6 weeks or until released by your surgeon.  °Do not drive while taking narcotics.  °Wear elastic stockings for two weeks following surgery during the day but you may remove then at night.  °Make sure you keep all of your appointments after your operation with all of your doctors and caregivers. You should call the office at the above phone number and make an appointment for approximately two weeks after the date of your surgery. °Please pick up a stool softener and laxative for home use as long as you are requiring pain medications. °· ICE to the affected hip every three hours for 30 minutes at a time and then as needed for pain and swelling. Continue to use ice on the hip for pain and swelling from surgery. You may notice swelling that will progress down to the foot and ankle.  This is normal after surgery.  Elevate the leg when you are not up walking on it.   °It is important for you to complete the blood thinner medication as prescribed by your doctor. °· Continue to use the breathing machine which will help keep your temperature down.  It is common for your temperature to cycle up and down following surgery, especially at night when you are not up moving around and exerting yourself.  The breathing machine keeps your lungs expanded and your temperature down. ° °RANGE OF MOTION AND STRENGTHENING EXERCISES  °These exercises are   designed to help you keep full movement of your hip joint. Follow your caregiver's or physical therapist's instructions. Perform all exercises about fifteen times, three times per day or as directed. Exercise both hips, even if you have had only one joint replacement. These exercises can be done on a training (exercise) mat, on the floor, on a table or on a bed. Use whatever works the best and is most comfortable for you. Use music or television while you are exercising so that the exercises  are a pleasant break in your day. This will make your life better with the exercises acting as a break in routine you can look forward to.  °Lying on your back, slowly slide your foot toward your buttocks, raising your knee up off the floor. Then slowly slide your foot back down until your leg is straight again.  °Lying on your back spread your legs as far apart as you can without causing discomfort.  °Lying on your side, raise your upper leg and foot straight up from the floor as far as is comfortable. Slowly lower the leg and repeat.  °Lying on your back, tighten up the muscle in the front of your thigh (quadriceps muscles). You can do this by keeping your leg straight and trying to raise your heel off the floor. This helps strengthen the largest muscle supporting your knee.  °Lying on your back, tighten up the muscles of your buttocks both with the legs straight and with the knee bent at a comfortable angle while keeping your heel on the floor.  ° °SKILLED REHAB INSTRUCTIONS: °If the patient is transferred to a skilled rehab facility following release from the hospital, a list of the current medications will be sent to the facility for the patient to continue.  When discharged from the skilled rehab facility, please have the facility set up the patient's Home Health Physical Therapy prior to being released. Also, the skilled facility will be responsible for providing the patient with their medications at time of release from the facility to include their pain medication and their blood thinner medication. If the patient is still at the rehab facility at time of the two week follow up appointment, the skilled rehab facility will also need to assist the patient in arranging follow up appointment in our office and any transportation needs. ° °MAKE SURE YOU:  °Understand these instructions.  °Will watch your condition.  °Will get help right away if you are not doing well or get worse. ° °Pick up stool softner and  laxative for home use following surgery while on pain medications. °Do not remove your dressing. °The dressing is waterproof--it is OK to take showers. °Continue to use ice for pain and swelling after surgery. °Do not use any lotions or creams on the incision until instructed by your surgeon. °Total Hip Protocol. ° ° °

## 2017-06-21 NOTE — Op Note (Signed)
OPERATIVE REPORT  SURGEON: Samson Frederic, MD   ASSISTANT: Staff.  PREOPERATIVE DIAGNOSIS: Displaced Left femoral neck fracture.   POSTOPERATIVE DIAGNOSIS: Same.  PROCEDURE: Left total hip arthroplasty, anterior approach.   IMPLANTS: DePuy Tri Lock stem, size 5, hi offset. DePuy Pinnacle Cup, size 50 mm. DePuy Altrx liner, size 32 by 50 mm, neutral. DePuy Biolox ceramic head ball, size 32 + 1 mm.  ANESTHESIA:  General  ESTIMATED BLOOD LOSS:-200 mL    ANTIBIOTICS: 2 g Ancef.  DRAINS: None.  COMPLICATIONS: None.   CONDITION: PACU - hemodynamically stable.   BRIEF CLINICAL NOTE: Colleen Aguilar is a 72 y.o. female with a history of cognitive impairment who lives independently.  She sustained a ground-level fall several days ago, and had progressive left hip pain and inability to weight-bear.  She is on chronic Eliquis for history of blood clot.  She came to the hospital, and x-rays revealed a displaced left femoral neck fracture.  She was admitted to the hospitalist service for perioperative risk stratification and medical optimization. The patient was indicated for total hip arthroplasty. The risks, benefits, and alternatives to the procedure were explained, and the patient elected to proceed.  PROCEDURE IN DETAIL: Surgical site was marked by myself in the pre-op holding area. Once inside the operating room, spinal anesthesia was obtained, and a foley catheter was inserted. The patient was then positioned on the Hana table. All bony prominences were well padded. The hip was prepped and draped in the normal sterile surgical fashion. A time-out was called verifying side and site of surgery. The patient received IV antibiotics within 60 minutes of beginning the procedure.  The direct anterior approach to the hip was performed through the Hueter interval. Lateral femoral circumflex vessels were treated with the Auqumantys. The anterior capsule was exposed and an inverted T  capsulotomy was made. The fracture hematoma was encountered and evacuated.  She had a comminuted subcapital femoral neck fracture.  The femoral neck cut was made to the level of the templated cut. A corkscrew was placed into the head and the head was removed. The head was passed to the back table and was measured.  Acetabular exposure was achieved, and the pulvinar and labrum were excised. Sequential reaming of the acetabulum was then performed up to a size 49 mm reamer. A 50 mm cup was then opened and impacted into place at approximately 40 degrees of abduction and 20 degrees of anteversion. The final polyethylene liner was impacted into place and acetabular osteophytes were removed.   I then gained femoral exposure taking care to protect the abductors and greater trochanter. This was performed using standard external rotation, extension, and adduction. The capsule was peeled off the inner aspect of the greater trochanter, taking care to preserve the short external rotators. A cookie cutter was used to enter the femoral canal, and then the femoral canal finder was placed. Sequential broaching was performed up to a size 5. Calcar planer was used on the femoral neck remnant. I placed a hi offset neck and a trial head ball. The hip was reduced. Leg lengths and offset were checked fluoroscopically. The hip was dislocated and trial components were removed. The final implants were placed, and the hip was reduced.  Fluoroscopy was used to confirm component position and leg lengths. At 90 degrees of external rotation and full extension, the hip was stable to an anterior directed force.  The wound was copiously irrigated with normal saline using pulse lavage. Marcaine solution was  injected into the periarticular soft tissue. The wound was closed in layers using #1 Vicryl and V-Loc for the fascia, 2-0 Vicryl for the subcutaneous fat, 2-0 Monocryl for the deep dermal layer, 3-0 running Monocryl  subcuticular stitch, and Dermabond for the skin. Once the glue was fully dried, an Aquacell Ag dressing was applied. The patient was transported to the recovery room in stable condition. Sponge, needle, and instrument counts were correct at the end of the case x2. The patient tolerated the procedure well and there were no known complications.  Postoperatively, the patient will be  readmitted to the hospitalist service.  She may weight-bear as tolerated with a walker.  We will resume Eliquis tomorrow at 2.5 mg twice daily for 24-48 hours, and then resume her normal dose.  She will work with physical and occupational therapy.  She will undergo disposition planning.  She will return to the office in 2 weeks for routine postoperative care.

## 2017-06-21 NOTE — Interval H&P Note (Signed)
History and Physical Interval Note:  06/21/2017 4:46 PM  Colleen Aguilar  has presented today for surgery, with the diagnosis of displaced left femoral neck fracture  The various methods of treatment have been discussed with the patient and family. After consideration of risks, benefits and other options for treatment, the patient has consented to  Procedure(s): TOTAL HIP ARTHROPLASTY ANTERIOR APPROACH LEFT (Left) as a surgical intervention .  The patient's history has been reviewed, patient examined, no change in status, stable for surgery.  I have reviewed the patient's chart and labs.  Questions were answered to the patient's satisfaction.    I was asked to take over care by Dr. Ranell PatrickNorris.  Patient has subacute displaced left femoral neck fracture.  She has mild cognitive impairment.  She does live independently.  Last eliquis was Tuesday.    Iline OvenBrian J Brentin Shin

## 2017-06-21 NOTE — Anesthesia Procedure Notes (Signed)
Procedure Name: Intubation Date/Time: 06/21/2017 5:02 PM Performed by: Nykolas Bacallao D, CRNA Pre-anesthesia Checklist: Patient identified, Emergency Drugs available, Suction available and Patient being monitored Patient Re-evaluated:Patient Re-evaluated prior to induction Oxygen Delivery Method: Circle system utilized Preoxygenation: Pre-oxygenation with 100% oxygen Induction Type: IV induction Ventilation: Mask ventilation without difficulty Laryngoscope Size: Mac and 4 Grade View: Grade I Tube type: Oral Tube size: 7.0 mm Number of attempts: 1 Airway Equipment and Method: Stylet and Oral airway Placement Confirmation: ETT inserted through vocal cords under direct vision,  positive ETCO2 and breath sounds checked- equal and bilateral Secured at: 21 cm Tube secured with: Tape Dental Injury: Teeth and Oropharynx as per pre-operative assessment

## 2017-06-21 NOTE — Progress Notes (Addendum)
PROGRESS NOTE    Colleen Aguilar  MVH:[id number] DOB: 12-Dec-1945 DOA: 06/20/2017 PCP: No primary care provider on file.    Brief Narrative:  Patient is 72 year old female history of intellectual disability, asthma, type 2 diabetes on oral medications, probable dementia, osteoporosis, on chronic anticoagulation presenting after mechanical fall and found to have an impacted subcapital left femoral neck fracture.  Patient noted not to be a reliable historian on admission.  Per notes and documentation in chart is noted that patient had a fall 4 days prior to admission and noted to be limping by nursing staff at the facility.  Plain films done showed a femoral neck fracture.  Patient sent to the ED and admitted.  Patient seen by orthopedics.  Patient to the OR 06/21/2017 for repair.   Assessment & Plan:   Principal Problem:   Hip fracture requiring operative repair, left, closed, initial encounter [facility](HCC) Active Problems:   Delay in development   UNSPECIFIED MENTAL RETARDATION   Asthma   Acute venous embolism and thrombosis of deep vessels of proximal lower extremity (HCC)   DM (diabetes mellitus), type 2 (HCC)   #1 impacted subcapital left femoral neck fracture Secondary to mechanical fall.  Patient has been seen in consultation by orthopedics, And patient currently non-weightbearing to left lower extremity.  Patient to the OR therapy orthopedics.  Eliquis on hold.  2.  Asthma Currently stable.  Continue Symbicort, nebs as needed.  Follow.  3.  Diabetes mellitus type 2 CBGs have ranged from 95-117.  Check a hemoglobin A1c.  Continue sliding scale insulin.  Hold oral hypoglycemic agents.  4.  Developmental delay/unspecified mental retardation  5.  Long-term anticoagulation Patient noted to be on Eliquis prior to admission.  Not sure what patient was on anticoagulation for.  Eliquis currently on hold.  Follow.  6.  Osteoporosis Aldendronate on hold.  Continue calcium with vitamin D.  7.?   Dementia Continue Aricept.  8.  Psychiatry Patient on Ativan and sertraline.  Continue those for now.  Follow.  9.  Hypokalemia/hypomagnesemia Replete.   DVT prophylaxis: SCDs.  Postop per orthopedics. Code Status: Full Family Communication: Updated patient no family at bedside. Disposition Plan: Likely skilled nursing facility pending fracture repair.   Consultants:   Orthopedics: Dr. Ranell PatrickNorris 06/20/2017  Procedures:   Chest x-ray 06/20/2017  Plain films of the left hip and pelvis 06/20/2017    Antimicrobials:   None   Subjective: Patient laying in bed.  States pain is controlled.  Denies chest pain or shortness of breath.  Awaiting surgery.  Objective: Vitals:   06/20/17 1945 06/20/17 2106 06/21/17 0529 06/21/17 1420  BP: (!) 121/59  125/67 140/62  Pulse: 64  63 65  Resp: 18  18 16   Temp: 98.1 F (36.7 C)  98.4 F (36.9 C) 97.7 F (36.5 C)  TempSrc: Oral  Oral Oral  SpO2: 100% 96% 97% 98%    Intake/Output Summary (Last 24 hours) at 06/21/2017 1626 Last data filed at 06/21/2017 1512 Gross per 24 hour  Intake 1547.92 ml  Output 900 ml  Net 647.92 ml   There were no vitals filed for this visit.  Examination:  General exam: Frail.  Cachectic. Respiratory system: Clear to auscultation anterior lung fields. Respiratory effort normal. Cardiovascular system: S1 & S2 heard, RRR. No JVD, murmurs, rubs, gallops or clicks. No pedal edema. Gastrointestinal system: Abdomen is nondistended, soft and nontender. No organomegaly or masses felt. Normal bowel sounds heard. Central nervous system: Alert and oriented. No  focal neurological deficits. Extremities: Symmetric 5 x 5 power. Skin: No rashes, lesions or ulcers Psychiatry: Judgement and insight appear normal. Mood & affect appropriate.     Data Reviewed: I have personally reviewed following labs and imaging studies  CBC: Recent Labs  Lab 06/20/17 1439 06/21/17 0544  WBC 6.4 6.8  NEUTROABS 5.0  --   HGB 12.2  10.6*  HCT 37.0 32.3*  MCV 95.4 95.3  PLT 428* 376   Basic Metabolic Panel: Recent Labs  Lab 06/20/17 1439 06/21/17 0544  NA 139 142  K 3.7 3.4*  CL 101 107  CO2 28 24  GLUCOSE 117* 95  BUN 10 9  CREATININE 0.50 0.51  CALCIUM 9.4 8.5*  MG  --  1.6*   GFR: CrCl cannot be calculated (Unknown ideal weight.). Liver Function Tests: No results for input(s): AST, ALT, ALKPHOS, BILITOT, PROT, ALBUMIN in the last 168 hours. No results for input(s): LIPASE, AMYLASE in the last 168 hours. No results for input(s): AMMONIA in the last 168 hours. Coagulation Profile: Recent Labs  Lab 06/20/17 1439  INR 0.90   Cardiac Enzymes: No results for input(s): CKTOTAL, CKMB, CKMBINDEX, TROPONINI in the last 168 hours. BNP (last 3 results) No results for input(s): PROBNP in the last 8760 hours. HbA1C: No results for input(s): HGBA1C in the last 72 hours. CBG: Recent Labs  Lab 06/20/17 2233 06/21/17 0817 06/21/17 1432  GLUCAP 101* 100* 95   Lipid Profile: No results for input(s): CHOL, HDL, LDLCALC, TRIG, CHOLHDL, LDLDIRECT in the last 72 hours. Thyroid Function Tests: No results for input(s): TSH, T4TOTAL, FREET4, T3FREE, THYROIDAB in the last 72 hours. Anemia Panel: Recent Labs    06/21/17 0929  VITAMINB12 168*  FOLATE 9.4  FERRITIN 35  TIBC 319  IRON 46   Sepsis Labs: No results for input(s): PROCALCITON, LATICACIDVEN in the last 168 hours.  Recent Results (from the past 240 hour(s))  Surgical pcr screen     Status: None   Collection Time: 06/21/17 12:16 PM  Result Value Ref Range Status   MRSA, PCR NEGATIVE NEGATIVE Final   Staphylococcus aureus NEGATIVE NEGATIVE Final    Comment: (NOTE) The Xpert SA Assay (FDA approved for NASAL specimens in patients 22 years of age and older), is one component of a comprehensive surveillance program. It is not intended to diagnose infection nor to guide or monitor treatment. Performed at [facility], [address] [place], [place] [address]          Radiology Studies: Dg Chest 2 View  Result Date: 06/20/2017 CLINICAL DATA:  Left hip fracture. EXAM: CHEST - 2 VIEW COMPARISON:  08/04/2011 and 12/31/2010. FINDINGS: The heart size and mediastinal contours are stable. The lungs are clear. There is no pleural effusion or pneumothorax. No acute fractures are identified. There is a stable thoracolumbar scoliosis. IMPRESSION: No active cardiopulmonary process.  Scoliosis. Electronically Signed   By: Carey Bullocks M.D.   On: 06/20/2017 13:29   Dg Hip Unilat W Or Wo Pelvis 2-3 Views Left  Result Date: 06/20/2017 CLINICAL DATA:  Left hip pain since falling 4 days ago. EXAM: DG HIP (WITH OR WITHOUT PELVIS) 2-3V LEFT COMPARISON:  None. FINDINGS: The bones are demineralized. There is an impacted subcapital fracture of the left femoral neck. No other fractures are demonstrated. The femoral heads are located. Mild degenerative changes are present in the lower lumbar spine. IMPRESSION: Impacted subcapital fracture of the left femoral neck. Electronically Signed   By: Chrissie Noa  Purcell Mouton M.D.   On: 06/20/2017 13:28        Scheduled Meds: . [MAR Hold] calcium-vitamin D  1 tablet Oral BID  . [MAR Hold] cyanocobalamin  1,000 mcg Intramuscular Daily  . [MAR Hold] docusate sodium  100 mg Oral Daily  . [MAR Hold] donepezil  10 mg Oral QHS  . [MAR Hold] feeding supplement (ENSURE ENLIVE)  237 mL Oral TID BM  . [MAR Hold] insulin aspart  0-9 Units Subcutaneous Q6H  . [MAR Hold] insulin aspart  0-9 Units Subcutaneous Once  . [MAR Hold] LORazepam  0.25 mg Oral BID  . [MAR Hold] mometasone-formoterol  2 puff Inhalation BID  . [MAR Hold] potassium chloride  40 mEq Oral Once  . [MAR Hold] sertraline  37.5 mg Oral Daily   Continuous Infusions: . sodium chloride 125 mL/hr at 06/20/17 1801  . acetaminophen    . acetaminophen    . ceFAZolin    .  ceFAZolin (ANCEF) IV       LOS: 1 day    Time spent: 35  mins    Ramiro Harvest, MD [facility] Pager [phone]  If 7PM-7AM, please contact night-coverage [email or web address] Password [facility] 06/21/2017, 4:26 PM

## 2017-06-21 NOTE — Transfer of Care (Signed)
Immediate Anesthesia Transfer of Care Note  Patient: Colleen Aguilar  Procedure(s) Performed: TOTAL HIP ARTHROPLASTY ANTERIOR APPROACH LEFT (Left )  Patient Location: PACU  Anesthesia Type:General  Level of Consciousness: awake and alert   Airway & Oxygen Therapy: Patient Spontanous Breathing and Patient connected to face mask oxygen  Post-op Assessment: Report given to RN and Post -op Vital signs reviewed and stable  Post vital signs: Reviewed  Last Vitals:  Vitals:   06/21/17 0529 06/21/17 1420  BP: 125/67 140/62  Pulse: 63 65  Resp: 18 16  Temp: 36.9 C 36.5 C  SpO2: 97% 98%    Last Pain:  Vitals:   06/21/17 1420  TempSrc: Oral  PainSc:       Patients Stated Pain Goal: 2 (06/20/17 2007)  Complications: No apparent anesthesia complications

## 2017-06-21 NOTE — Anesthesia Preprocedure Evaluation (Addendum)
Anesthesia Evaluation  Patient identified by MRN, date of birth, ID band Patient awake    Reviewed: Allergy & Precautions, H&P , NPO status , Patient's Chart, lab work & pertinent test results  Airway Mallampati: II  TM Distance: >3 FB Neck ROM: Full    Dental no notable dental hx. (+) Edentulous Upper, Partial Lower, Dental Advisory Given   Pulmonary asthma ,    Pulmonary exam normal breath sounds clear to auscultation       Cardiovascular + Peripheral Vascular Disease  negative cardio ROS   Rhythm:Regular Rate:Normal     Neuro/Psych  Headaches, Anxiety    GI/Hepatic negative GI ROS, Neg liver ROS,   Endo/Other  diabetes, Type 2, Oral Hypoglycemic Agents  Renal/GU negative Renal ROS  negative genitourinary   Musculoskeletal   Abdominal   Peds  Hematology negative hematology ROS (+)   Anesthesia Other Findings   Reproductive/Obstetrics negative OB ROS                            Anesthesia Physical Anesthesia Plan  ASA: II  Anesthesia Plan: General   Post-op Pain Management:    Induction: Intravenous  PONV Risk Score and Plan: 3 and Ondansetron, Treatment may vary due to age or medical condition and Dexamethasone  Airway Management Planned: Oral ETT  Additional Equipment:   Intra-op Plan:   Post-operative Plan: Extubation in OR  Informed Consent: I have reviewed the patients History and Physical, chart, labs and discussed the procedure including the risks, benefits and alternatives for the proposed anesthesia with the patient or authorized representative who has indicated his/her understanding and acceptance.   Dental advisory given  Plan Discussed with: CRNA  Anesthesia Plan Comments:         Anesthesia Quick Evaluation

## 2017-06-21 NOTE — Anesthesia Procedure Notes (Signed)
Date/Time: 06/21/2017 6:50 PM Performed by: Minerva EndsMirarchi, Marialuiza Car M, CRNA Oxygen Delivery Method: Simple face mask Placement Confirmation: positive ETCO2 and breath sounds checked- equal and bilateral Dental Injury: Teeth and Oropharynx as per pre-operative assessment

## 2017-06-22 ENCOUNTER — Encounter (HOSPITAL_COMMUNITY): Payer: Self-pay | Admitting: Orthopedic Surgery

## 2017-06-22 DIAGNOSIS — E538 Deficiency of other specified B group vitamins: Secondary | ICD-10-CM

## 2017-06-22 DIAGNOSIS — I824Y9 Acute embolism and thrombosis of unspecified deep veins of unspecified proximal lower extremity: Secondary | ICD-10-CM

## 2017-06-22 DIAGNOSIS — D62 Acute posthemorrhagic anemia: Secondary | ICD-10-CM | POA: Diagnosis not present

## 2017-06-22 LAB — CBC WITH DIFFERENTIAL/PLATELET
BASOS ABS: 0 10*3/uL (ref 0.0–0.1)
BASOS PCT: 0 %
Eosinophils Absolute: 0 10*3/uL (ref 0.0–0.7)
Eosinophils Relative: 0 %
HEMATOCRIT: 26.2 % — AB (ref 36.0–46.0)
HEMOGLOBIN: 8.9 g/dL — AB (ref 12.0–15.0)
LYMPHS PCT: 10 %
Lymphs Abs: 0.8 10*3/uL (ref 0.7–4.0)
MCH: 32.2 pg (ref 26.0–34.0)
MCHC: 34 g/dL (ref 30.0–36.0)
MCV: 94.9 fL (ref 78.0–100.0)
MONOS PCT: 7 %
Monocytes Absolute: 0.6 10*3/uL (ref 0.1–1.0)
NEUTROS ABS: 6.8 10*3/uL (ref 1.7–7.7)
NEUTROS PCT: 83 %
Platelets: 351 10*3/uL (ref 150–400)
RBC: 2.76 MIL/uL — ABNORMAL LOW (ref 3.87–5.11)
RDW: 14.1 % (ref 11.5–15.5)
WBC: 8.2 10*3/uL (ref 4.0–10.5)

## 2017-06-22 LAB — GLUCOSE, CAPILLARY
Glucose-Capillary: 109 mg/dL — ABNORMAL HIGH (ref 65–99)
Glucose-Capillary: 121 mg/dL — ABNORMAL HIGH (ref 65–99)
Glucose-Capillary: 126 mg/dL — ABNORMAL HIGH (ref 65–99)
Glucose-Capillary: 128 mg/dL — ABNORMAL HIGH (ref 65–99)
Glucose-Capillary: 243 mg/dL — ABNORMAL HIGH (ref 65–99)

## 2017-06-22 LAB — BASIC METABOLIC PANEL
ANION GAP: 7 (ref 5–15)
BUN: 12 mg/dL (ref 6–20)
CHLORIDE: 108 mmol/L (ref 101–111)
CO2: 24 mmol/L (ref 22–32)
Calcium: 7.8 mg/dL — ABNORMAL LOW (ref 8.9–10.3)
Creatinine, Ser: 0.54 mg/dL (ref 0.44–1.00)
GFR calc non Af Amer: >60 mL/min (ref >60)
GLUCOSE: 135 mg/dL — AB (ref 65–99)
POTASSIUM: 3.6 mmol/L (ref 3.5–5.1)
Sodium: 139 mmol/L (ref 135–145)

## 2017-06-22 LAB — MAGNESIUM: MAGNESIUM: 2.3 mg/dL (ref 1.7–2.4)

## 2017-06-22 MED ORDER — METHOCARBAMOL 500 MG PO TABS
500.0000 mg | ORAL_TABLET | Freq: Three times a day (TID) | ORAL | Status: DC | PRN
  Administered 2017-06-22 – 2017-06-25 (×5): 500 mg via ORAL
  Filled 2017-06-22 (×4): qty 1

## 2017-06-22 MED ORDER — INSULIN ASPART 100 UNIT/ML ~~LOC~~ SOLN
0.0000 [IU] | Freq: Three times a day (TID) | SUBCUTANEOUS | Status: DC
  Administered 2017-06-22 – 2017-06-25 (×2): 1 [IU] via SUBCUTANEOUS

## 2017-06-22 MED ORDER — METHOCARBAMOL 1000 MG/10ML IJ SOLN
500.0000 mg | Freq: Three times a day (TID) | INTRAMUSCULAR | Status: DC | PRN
  Filled 2017-06-22: qty 5

## 2017-06-22 MED ORDER — HYDROCODONE-ACETAMINOPHEN 5-325 MG PO TABS: 1.0000 | ORAL_TABLET | ORAL | 0 refills | Status: DC | PRN

## 2017-06-22 MED ORDER — LIP MEDEX EX OINT
TOPICAL_OINTMENT | CUTANEOUS | Status: AC
  Administered 2017-06-22: 21:00:00
  Filled 2017-06-22: qty 7

## 2017-06-22 NOTE — Evaluation (Signed)
Physical Therapy Evaluation Patient Details Name: Colleen Aguilar MRN: [id number] DOB: Nov 02, 1945 Today's Date: 06/22/2017   History of Present Illness  72 yo female s/p fall at facility resulting in L hip fx, pt underwent L DA THA on 06/21/17 per Dr Linna CapriceSwinteck; PMH:  unspecified MR, DM, asthma  Clinical Impression  Pt admitted with above diagnosis. Pt currently with functional limitations due to the deficits listed below (see PT Problem List). Pt easily agitated, participates with firm encouragement and manual facilitation; will need SNF;  Pt will benefit from skilled PT to increase their independence and safety with mobility to allow discharge to the venue listed below.       Follow Up Recommendations SNF    Equipment Recommendations  None recommended by PT    Recommendations for Other Services       Precautions / Restrictions Precautions Precautions: Fall Restrictions Weight Bearing Restrictions: No Other Position/Activity Restrictions: WBAT      Mobility  Bed Mobility Overal bed mobility: Needs Assistance Bed Mobility: Supine to Sit     Supine to sit: Max assist;+2 for physical assistance;+2 for safety/equipment     General bed mobility comments: bed pad used to assist scooting, assist to elevate trunk and bring LEs off bed  Transfers Overall transfer level: Needs assistance Equipment used: Rolling walker (2 wheeled) Transfers: Sit to/from Stand Sit to Stand: +2 physical assistance;+2 safety/equipment;Max assist;From elevated surface         General transfer comment: assist to rise and stabilize, cues for safety and hand placement  Ambulation/Gait Ambulation/Gait assistance: Max assist;+2 physical assistance;+2 safety/equipment Ambulation Distance (Feet): 4 Feet Assistive device: Rolling walker (2 wheeled) Gait Pattern/deviations: Step-to pattern;Decreased step length - right;Decreased step length - left;Antalgic Gait velocity: decr   General Gait Details:  multi-modal cues for sequence, safety use of UEs  Stairs            Wheelchair Mobility    Modified Rankin (Stroke Patients Only)       Balance Overall balance assessment: Needs assistance;History of Falls Sitting-balance support: Feet supported;No upper extremity supported;Single extremity supported Sitting balance-Leahy Scale: Fair       Standing balance-Leahy Scale: Zero Standing balance comment: reliant on UEs and external support                              Pertinent Vitals/Pain Pain Assessment: 0-10 Pain Score: 10-Worst pain ever("11") Pain Descriptors / Indicators: Moaning;Grimacing;Guarding Pain Intervention(s): Limited activity within patient's tolerance;Monitored during session;Premedicated before session;Repositioned;Ice applied    Home Living Family/patient expects to be discharged to:: Skilled nursing facility                 Additional Comments: pt from ALF - [facility]    Prior Function Level of Independence: Independent         Comments: per pt she does not amb with a walker or require assist; baseline "unspecified MR"; no family present     Hand Dominance        Extremity/Trunk Assessment   Upper Extremity Assessment Upper Extremity Assessment: Overall WFL for tasks assessed    Lower Extremity Assessment Lower Extremity Assessment: LLE deficits/detail LLE Deficits / Details: pt with significant muscle guarding, resistant to imposed movement LLE: Unable to fully assess due to pain       Communication   Communication: No difficulties  Cognition Arousal/Alertness: Awake/alert Behavior During Therapy: Agitated(easily agitated) Overall Cognitive Status: History of cognitive  impairments - at baseline                                        General Comments      Exercises     Assessment/Plan    PT Assessment Patient needs continued PT services  PT Problem List Decreased  strength;Decreased activity tolerance;Decreased mobility;Decreased safety awareness;Pain;Decreased cognition;Decreased balance;Decreased knowledge of use of DME       PT Treatment Interventions DME instruction;Gait training;Functional mobility training;Therapeutic exercise;Patient/family education;Balance training;Therapeutic activities    PT Goals (Current goals can be found in the Care Plan section)  Acute Rehab PT Goals Patient Stated Goal: less pain and be left alone PT Goal Formulation: With patient Time For Goal Achievement: 07/06/17 Potential to Achieve Goals: Good    Frequency Min 3X/week   Barriers to discharge        Co-evaluation               AM-PAC PT "6 Clicks" Daily Activity  Outcome Measure Difficulty turning over in bed (including adjusting bedclothes, sheets and blankets)?: Unable Difficulty moving from lying on back to sitting on the side of the bed? : Unable Difficulty sitting down on and standing up from a chair with arms (e.g., wheelchair, bedside commode, etc,.)?: Unable Help needed moving to and from a bed to chair (including a wheelchair)?: Total Help needed walking in hospital room?: Total Help needed climbing 3-5 steps with a railing? : Total 6 Click Score: 6    End of Session Equipment Utilized During Treatment: Gait belt Activity Tolerance: Patient limited by pain Patient left: in chair;with call bell/phone within reach;with chair alarm set Nurse Communication: Mobility status PT Visit Diagnosis: Unsteadiness on feet (R26.81);Difficulty in walking, not elsewhere classified (R26.2)    Time: 4098-1191 PT Time Calculation (min) (ACUTE ONLY): 21 min   Charges:   PT Evaluation $PT Eval Moderate Complexity: 1 Mod     PT G CodesDrucilla Chalet, PT Pager: [phone] 06/22/2017   Drucilla Chalet 06/22/2017, 1:26 PM

## 2017-06-22 NOTE — [place] FL2 (Signed)
[place] MEDICAID FL2 LEVEL OF CARE SCREENING TOOL     IDENTIFICATION  Patient Name: Colleen Aguilar Birthdate: 11/20/1945 Sex: female Admission Date (Current Location): 06/20/2017  [facility] [place]   Facility and Address:  [facility],  [address], [place] [id number]      Provider Number: [id number]  Attending Physician Name and Address:  Rodolph Bonghompson, Daniel V, MD  Relative Name and Phone Number:       Current Level of Care: Hospital Recommended Level of Care: Skilled Nursing Facility Prior Approval Number:    Date Approved/Denied:   PASRR Number: Pending   Discharge Plan:      Current Diagnoses: Patient Active Problem List   Diagnosis Date Noted  . Vitamin B12 deficiency 06/22/2017  . Postoperative anemia due to acute blood loss 06/22/2017  . DM (diabetes mellitus), type 2 (HCC) 06/21/2017  . Displaced fracture of left femoral neck (HCC) 06/21/2017  . Left displaced femoral neck fracture (HCC)   . Hip fracture requiring operative repair, left, closed, initial encounter (HCC) 06/20/2017  . Acute venous embolism and thrombosis of deep vessels of proximal lower extremity (HCC) 06/20/2017  . Need for lipid screening 08/12/2010  . Underweight 08/12/2010  . Chronic headaches 08/12/2010  . 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  . FLATULENCE ERUCTATION AND GAS PAIN 02/03/2009  . DIARRHEA 02/03/2009  . ADVERSE REACTION TO MEDICATION 02/03/2009  . HEADACHE 10/13/2008  . URINARY INCONTINENCE 10/13/2008  . CERUMEN IMPACTION 09/21/2008  . HEMATURIA UNSPECIFIED 09/21/2008  . OTHER ANXIETY STATES 03/16/2008  . ELBOW INJURY 03/16/2008  . UNSPECIFIED MENTAL RETARDATION 11/15/2007  . CHEST WALL PAIN, ANTERIOR 11/15/2007  . Asthma 12/04/2006    Orientation RESPIRATION BLADDER Height & Weight     Self, Place  Normal Continent Weight: 100 lb 1.4 oz (45.4 kg) Height:  5\' 2"  (157.5 cm)  BEHAVIORAL SYMPTOMS/MOOD NEUROLOGICAL BOWEL NUTRITION STATUS       Continent Diet(Carb Modified. )  AMBULATORY STATUS COMMUNICATION OF NEEDS Skin   Extensive Assist Verbally (Surgical Incision )                       Personal Care Assistance Level of Assistance  Bathing, Feeding, Dressing Bathing Assistance: Limited assistance Feeding assistance: Independent Dressing Assistance: Limited assistance     Functional Limitations Info  Sight, Hearing, Speech Sight Info: Adequate Hearing Info: Adequate Speech Info: Adequate    SPECIAL CARE FACTORS FREQUENCY  OT (By licensed OT), Bowel and bladder program       OT Frequency: 7x/week Bowel and Bladder Program Frequency: 7x/week           Contractures Contractures Info: Not present    Additional Factors Info  Code Status, Allergies, Insulin Sliding Scale, Psychotropic Code Status Info: Fullcode Allergies Info: Allergies: No Known Allergies Psychotropic Info: Ativan, Zoloft  Insulin Sliding Scale Info: Yes, see d/c summary        Current Medications (06/22/2017):  This is the current hospital active medication list Current Facility-Administered Medications  Medication Dose Route Frequency Provider Last Rate Last Dose  . 0.9 %  sodium chloride infusion   Intravenous Continuous Rodolph Bonghompson, Daniel V, MD 100 mL/hr at 06/22/17 [phone]    . acetaminophen (TYLENOL) tablet 650 mg  650 mg Oral Q6H PRN Purohit, Salli QuarryShrey C, MD   650 mg at 06/21/17 [id number]   Or  . acetaminophen (TYLENOL) suppository 650 mg  650 mg Rectal Q6H PRN Purohit, Salli QuarryShrey C, MD      .  apixaban (ELIQUIS) tablet 2.5 mg  2.5 mg Oral BID Samson Frederic, MD   2.5 mg at 06/22/17 0920  . calcium-vitamin D (OSCAL WITH D) 500-200 MG-UNIT per tablet 1 tablet  1 tablet Oral BID Purohit, Salli Quarry, MD   1 tablet at 06/22/17 1011  . cyanocobalamin ((VITAMIN B-12)) injection 1,000 mcg  1,000 mcg Intramuscular Daily Rodolph Bong, MD   1,000 mcg at 06/22/17 1012  . docusate sodium (COLACE) capsule 100 mg  100 mg Oral Daily Purohit, Shrey C, MD    100 mg at 06/22/17 0924  . donepezil (ARICEPT) tablet 10 mg  10 mg Oral QHS Purohit, Shrey C, MD   10 mg at 06/21/17 2147  . feeding supplement (ENSURE ENLIVE) (ENSURE ENLIVE) liquid 237 mL  237 mL Oral TID BM Purohit, Shrey C, MD   237 mL at 06/22/17 1015  . HYDROcodone-acetaminophen (NORCO/VICODIN) 5-325 MG per tablet 1-2 tablet  1-2 tablet Oral Q4H PRN Purohit, Salli Quarry, MD   2 tablet at 06/22/17 1011  . insulin aspart (novoLOG) injection 0-9 Units  0-9 Units Subcutaneous Once Rodolph Bong, MD      . insulin aspart (novoLOG) injection 0-9 Units  0-9 Units Subcutaneous TID AC & HS Swinteck, Arlys John, MD      . LORazepam (ATIVAN) tablet 0.25 mg  0.25 mg Oral BID Purohit, Shrey C, MD   0.25 mg at 06/22/17 0919  . menthol-cetylpyridinium (CEPACOL) lozenge 3 mg  1 lozenge Oral PRN Swinteck, Arlys John, MD       Or  . phenol (CHLORASEPTIC) mouth spray 1 spray  1 spray Mouth/Throat PRN Swinteck, Arlys John, MD      . methocarbamol (ROBAXIN) 500 mg in dextrose 5 % 50 mL IVPB  500 mg Intravenous Q8H PRN Rodolph Bong, MD      . methocarbamol (ROBAXIN) tablet 500 mg  500 mg Oral Q8H PRN Rodolph Bong, MD   500 mg at 06/22/17 1216  . metoCLOPramide (REGLAN) tablet 5-10 mg  5-10 mg Oral Q8H PRN Swinteck, Arlys John, MD       Or  . metoCLOPramide (REGLAN) injection 5-10 mg  5-10 mg Intravenous Q8H PRN Swinteck, Arlys John, MD      . mometasone-formoterol (DULERA) 200-5 MCG/ACT inhaler 2 puff  2 puff Inhalation BID Purohit, Salli Quarry, MD   2 puff at 06/21/17 0801  . ondansetron (ZOFRAN) tablet 4 mg  4 mg Oral Q6H PRN Purohit, Shrey C, MD       Or  . ondansetron (ZOFRAN) injection 4 mg  4 mg Intravenous Q6H PRN Purohit, Shrey C, MD      . polyethylene glycol (MIRALAX / GLYCOLAX) packet 17 g  17 g Oral Daily PRN Purohit, Shrey C, MD      . polyvinyl alcohol (LIQUIFILM TEARS) 1.4 % ophthalmic solution 1 drop  1 drop Both Eyes TID PRN Purohit, Shrey C, MD      . potassium chloride SA (K-DUR,KLOR-CON) CR tablet 40 mEq   40 mEq Oral Once Rodolph Bong, MD      . senna (SENOKOT) tablet 8.6 mg  1 tablet Oral BID Samson Frederic, MD   8.6 mg at 06/22/17 0921  . sertraline (ZOLOFT) tablet 37.5 mg  37.5 mg Oral Daily Purohit, Shrey C, MD   37.5 mg at 06/22/17 0922  . sorbitol 70 % solution 30 mL  30 mL Oral Daily PRN Purohit, Salli Quarry, MD         Discharge Medications: Please see discharge  summary for a list of discharge medications.  Relevant Imaging Results:  Relevant Lab Results:   Additional Information ssn:[phone]  Clearance Coots, LCSW

## 2017-06-22 NOTE — Progress Notes (Signed)
    Subjective:  Patient reports pain as mild to moderate.  Denies N/V/CP/SOB.   Objective:   VITALS:   Vitals:   06/21/17 2200 06/21/17 2311 06/22/17 0211 06/22/17 0615  BP: 113/64 114/65 (!) 109/49 (!) 118/48  Pulse: 63 63 64 65  Resp: 17 16 17 16   Temp: 98.6 F (37 C) 97.7 F (36.5 C) 98.2 F (36.8 C) 97.6 F (36.4 C)  TempSrc: Oral Oral Oral Oral  SpO2: 99% 100% 98% 100%  Weight: 45.4 kg (100 lb 1.4 oz)     Height: 5\' 2"  (1.575 m)       NAD Sensation intact distally Intact pulses distally Dorsiflexion/Plantar flexion intact Incision: dressing C/D/I Compartment soft   Lab Results  Component Value Date   WBC 8.2 06/22/2017   HGB 8.9 (L) 06/22/2017   HCT 26.2 (L) 06/22/2017   MCV 94.9 06/22/2017   PLT 351 06/22/2017   BMET    Component Value Date/Time   NA 139 06/22/2017 0600   K 3.6 06/22/2017 0600   CL 108 06/22/2017 0600   CO2 24 06/22/2017 0600   GLUCOSE 135 (H) 06/22/2017 0600   BUN 12 06/22/2017 0600   CREATININE 0.54 06/22/2017 0600   CALCIUM 7.8 (L) 06/22/2017 0600   GFRNONAA >60 06/22/2017 0600   GFRAA >60 06/22/2017 0600     Assessment/Plan: 1 Day Post-Op   Principal Problem:   Hip fracture requiring operative repair, left, closed, initial encounter (HCC) Active Problems:   Delay in development   UNSPECIFIED MENTAL RETARDATION   Asthma   Acute venous embolism and thrombosis of deep vessels of proximal lower extremity (HCC)   DM (diabetes mellitus), type 2 (HCC)   Left displaced femoral neck fracture (HCC)   Displaced fracture of left femoral neck (HCC)   WBAT with walker DVT ppx: apixaban 2.5 mg PO BID through Saturday - then resume home dose, SCDs, TEDS PO pain control PT/OT Dispo: D/C planning   Colleen Aguilar 06/22/2017, 8:03 AM   Colleen FredericBrian Kaysan Peixoto, MD Cell [phone]

## 2017-06-22 NOTE — Progress Notes (Signed)
Physical Therapy Treatment Patient Details Name: Colleen Aguilar MRN: [id number] DOB: 1945/11/16 Today's Date: 06/22/2017    History of Present Illness 72 yo female s/p fall at facility resulting in L hip fx, pt underwent L DA THA on 06/21/17 per Dr Linna Caprice; PMH:  unspecified MR, DM, asthma    PT Comments    Pt progressing, pain improved this pm; continues to require +2 for safety, unable to do exercises d/t pain; will continue PT POC;    Follow Up Recommendations  SNF     Equipment Recommendations  None recommended by PT    Recommendations for Other Services       Precautions / Restrictions Precautions Precautions: Fall Restrictions Weight Bearing Restrictions: No Other Position/Activity Restrictions: WBAT    Mobility  Bed Mobility Overal bed mobility: Needs Assistance Bed Mobility: Sit to Supine     Supine to sit: Max assist;+2 for physical assistance;+2 for safety/equipment Sit to supine: Mod assist   General bed mobility comments: assist with bil LEs  Transfers Overall transfer level: Needs assistance Equipment used: Rolling walker (2 wheeled) Transfers: Sit to/from [employer] Sit to Stand: Mod assist;+2 safety/equipment Stand pivot transfers: Min assist;+2 safety/equipment;+2 physical assistance       General transfer comment: assist to rise and stabilize, cues for safety and hand placement  Ambulation/Gait Ambulation/Gait assistance: Min assist;+2 safety/equipment;+2 physical assistance Ambulation Distance (Feet): 5 Feet(pivotal steps chair to bed) Assistive device: Rolling walker (2 wheeled) Gait Pattern/deviations: Step-to pattern;Decreased step length - right;Decreased step length - left;Antalgic Gait velocity: decr   General Gait Details: multi-modal cues for sequence, safety use of UEs   Stairs            Wheelchair Mobility    Modified Rankin (Stroke Patients Only)       Balance Overall balance assessment: Needs  assistance;History of Falls Sitting-balance support: Feet supported;No upper extremity supported;Single extremity supported Sitting balance-Leahy Scale: Fair     Standing balance support: Bilateral upper extremity supported;During functional activity Standing balance-Leahy Scale: Poor Standing balance comment: reliant on UEs and external support                             Cognition Arousal/Alertness: Awake/alert Behavior During Therapy: WFL for tasks assessed/performed Overall Cognitive Status: History of cognitive impairments - at baseline                                 General Comments: pt generally more calm and more receptive to PT input (to a point)      Exercises      General Comments        Pertinent Vitals/Pain Pain Assessment: 0-10 Pain Score: 4  Pain Location: L hip Pain Descriptors / Indicators: Moaning;Grimacing;Guarding Pain Intervention(s): Limited activity within patient's tolerance;Monitored during session;Premedicated before session;Ice applied;Repositioned    Home Living Family/patient expects to be discharged to:: Skilled nursing facility               Additional Comments: pt from ALF - [facility]    Prior Function Level of Independence: Independent      Comments: per pt she does not amb with a walker or require assist; baseline "unspecified MR"; no family present   PT Goals (current goals can now be found in the care plan section) Acute Rehab PT Goals Patient Stated Goal: less pain and be left alone  PT Goal Formulation: With patient Time For Goal Achievement: 07/06/17 Potential to Achieve Goals: Good Progress towards PT goals: Progressing toward goals    Frequency    Min 3X/week      PT Plan Current plan remains appropriate    Co-evaluation              AM-PAC PT "6 Clicks" Daily Activity  Outcome Measure  Difficulty turning over in bed (including adjusting bedclothes, sheets and  blankets)?: Unable Difficulty moving from lying on back to sitting on the side of the bed? : Unable Difficulty sitting down on and standing up from a chair with arms (e.g., wheelchair, bedside commode, etc,.)?: Unable Help needed moving to and from a bed to chair (including a wheelchair)?: A Lot Help needed walking in hospital room?: A Lot Help needed climbing 3-5 steps with a railing? : Total 6 Click Score: 8    End of Session Equipment Utilized During Treatment: Gait belt Activity Tolerance: Patient limited by pain Patient left: in bed;with call bell/phone within reach;with bed alarm set Nurse Communication: Mobility status PT Visit Diagnosis: Unsteadiness on feet (R26.81);Difficulty in walking, not elsewhere classified (R26.2)     Time: [id number] PT Time Calculation (min) (ACUTE ONLY): 19 min  Charges:  $Therapeutic Activity: 8-22 mins                    G CodesDrucilla Chalet:       [name], PT Pager: [phone] 06/22/2017    Drucilla ChaletWILLIAMS,Benji Poynter 06/22/2017, 2:45 PM

## 2017-06-22 NOTE — Progress Notes (Addendum)
PROGRESS NOTE    Colleen Aguilar  WUJ:[id number] DOB: 01-03-1946 DOA: 06/20/2017 PCP: No primary care provider on file.    Brief Narrative:  Patient is 72 year old female history of intellectual disability, asthma, type 2 diabetes on oral medications, probable dementia, osteoporosis, on chronic anticoagulation presenting after mechanical fall and found to have an impacted subcapital left femoral neck fracture.  Patient noted not to be a reliable historian on admission.  Per notes and documentation in chart is noted that patient had a fall 4 days prior to admission and noted to be limping by nursing staff at the facility.  Plain films done showed a femoral neck fracture.  Patient sent to the ED and admitted.  Patient seen by orthopedics.  Patient to the OR 06/21/2017 for repair.   Assessment & Plan:   Principal Problem:   Hip fracture requiring operative repair, left, closed, initial encounter [facility](HCC) Active Problems:   Delay in development   UNSPECIFIED MENTAL RETARDATION   Asthma   Acute venous embolism and thrombosis of deep vessels of proximal lower extremity (HCC)   DM (diabetes mellitus), type 2 (HCC)   Left displaced femoral neck fracture (HCC)   Displaced fracture of left femoral neck (HCC)   Vitamin B12 deficiency   Postoperative anemia due to acute blood loss   #1 impacted subcapital left femoral neck fracture Secondary to mechanical fall.  Patient has been seen in consultation by orthopedics and s/p left total hip arthroplasty, anterior approach per Dr. Linna CapriceSwinteck 06/21/2017.  Patient started on low-dose Eliquis for DVT prophylaxis per orthopedics. Per orthopedics.  2.  Asthma Currently stable.  Continue Symbicort, nebs as needed.  Follow.  3.  Diabetes mellitus type 2 CBGs have ranged from 109-243.  Check a hemoglobin A1c.  Continue sliding scale insulin.  Hold oral hypoglycemic agents and resume on d/c.  4.  Developmental delay/unspecified mental retardation  5.  Long-term  anticoagulation Patient noted to be on Eliquis prior to admission.  Not sure what patient was on anticoagulation for.  Eliquis resumed at a lower dose per orthopedics. Follow.  6.  Osteoporosis Aldendronate on hold.  Continue calcium with vitamin D.  7.?  Dementia Continue Aricept.  8.  Psychiatry Continue Ativan and sertraline.  Follow.  9.  Hypokalemia/hypomagnesemia Repleted.  10 postop anemia/vitamin B12 deficiency Follow H&H.  Place on vitamin B12 1000 MCG's IM daily times 7 days, and then weekly times 1 month and then monthly.   DVT prophylaxis: Eliquis Code Status: Full Family Communication: Updated patient no family at bedside. Disposition Plan: Likely skilled nursing facility pending fracture repair.   Consultants:   Orthopedics: Dr. Ranell PatrickNorris 06/20/2017  Procedures:   Chest x-ray 06/20/2017  Plain films of the left hip and pelvis 06/20/2017 Left total hip arthroplasty, anterior approach--- per Dr. Linna CapriceSwinteck 06/21/2017.     Antimicrobials:   None   Subjective: Patient sitting up in chair.  Denies chest pain or shortness of breath.  Patient states she has worked with physical therapy and was in significant pain.    Objective: Vitals:   06/22/17 0211 06/22/17 0615 06/22/17 0940 06/22/17 1022  BP: (!) 109/49 (!) 118/48  (!) 115/50  Pulse: 64 65  72  Resp: 17 16  16   Temp: 98.2 F (36.8 C) 97.6 F (36.4 C)  98.4 F (36.9 C)  TempSrc: Oral Oral  Oral  SpO2: 98% 100% 100% 100%  Weight:      Height:        Intake/Output Summary (Last  24 hours) at 06/22/2017 1203 Last data filed at 06/22/2017 1100 Gross per 24 hour  Intake 4379.17 ml  Output 3175 ml  Net 1204.17 ml   Filed Weights   06/21/17 2200  Weight: 45.4 kg (100 lb 1.4 oz)    Examination:  General exam: Frail.  Cachectic. Frail. Respiratory system: Clear to auscultation bilaterally.  No wheezes, no crackles, no rhonchi.  Respiratory effort normal. Cardiovascular system: Regular rate and rhythm no  murmurs rubs or gallops.  No JVD.  No lower extremity edema.   Gastrointestinal system: Abdomen is soft, nontender, nondistended, positive bowel sounds.   Central nervous system: Alert and oriented. No focal neurological deficits. Extremities: Symmetric 5 x 5 power. Skin: No rashes, lesions or ulcers Psychiatry: Judgement and insight appear normal. Mood & affect appropriate.     Data Reviewed: I have personally reviewed following labs and imaging studies  CBC: Recent Labs  Lab 06/20/17 1439 06/21/17 0544 06/22/17 0600  WBC 6.4 6.8 8.2  NEUTROABS 5.0  --  6.8  HGB 12.2 10.6* 8.9*  HCT 37.0 32.3* 26.2*  MCV 95.4 95.3 94.9  PLT 428* 376 351   Basic Metabolic Panel: Recent Labs  Lab 06/20/17 1439 06/21/17 0544 06/22/17 0600  NA 139 142 139  K 3.7 3.4* 3.6  CL 101 107 108  CO2 28 24 24   GLUCOSE 117* 95 135*  BUN 10 9 12   CREATININE 0.50 0.51 0.54  CALCIUM 9.4 8.5* 7.8*  MG  --  1.6* 2.3   GFR: Estimated Creatinine Clearance: 46.2 mL/min (by C-G formula based on SCr of 0.54 mg/dL). Liver Function Tests: No results for input(s): AST, ALT, ALKPHOS, BILITOT, PROT, ALBUMIN in the last 168 hours. No results for input(s): LIPASE, AMYLASE in the last 168 hours. No results for input(s): AMMONIA in the last 168 hours. Coagulation Profile: Recent Labs  Lab 06/20/17 1439  INR 0.90   Cardiac Enzymes: No results for input(s): CKTOTAL, CKMB, CKMBINDEX, TROPONINI in the last 168 hours. BNP (last 3 results) No results for input(s): PROBNP in the last 8760 hours. HbA1C: No results for input(s): HGBA1C in the last 72 hours. CBG: Recent Labs  Lab 06/21/17 0817 06/21/17 1432 06/21/17 1904 06/22/17 0015 06/22/17 0621  GLUCAP 100* 95 110* 243* 126*   Lipid Profile: No results for input(s): CHOL, HDL, LDLCALC, TRIG, CHOLHDL, LDLDIRECT in the last 72 hours. Thyroid Function Tests: No results for input(s): TSH, T4TOTAL, FREET4, T3FREE, THYROIDAB in the last 72 hours. Anemia  Panel: Recent Labs    06/21/17 0929  VITAMINB12 168*  FOLATE 9.4  FERRITIN 35  TIBC 319  IRON 46   Sepsis Labs: No results for input(s): PROCALCITON, LATICACIDVEN in the last 168 hours.  Recent Results (from the past 240 hour(s))  Surgical pcr screen     Status: None   Collection Time: 06/21/17 12:16 PM  Result Value Ref Range Status   MRSA, PCR NEGATIVE NEGATIVE Final   Staphylococcus aureus NEGATIVE NEGATIVE Final    Comment: (NOTE) The Xpert SA Assay (FDA approved for NASAL specimens in patients 54 years of age and older), is one component of a comprehensive surveillance program. It is not intended to diagnose infection nor to guide or monitor treatment. Performed at [facility], [address], [place], [place] [address]          Radiology Studies: Dg Chest 2 View  Result Date: 06/20/2017 CLINICAL DATA:  Left hip fracture. EXAM: CHEST - 2 VIEW COMPARISON:  08/04/2011 and  12/31/2010. FINDINGS: The heart size and mediastinal contours are stable. The lungs are clear. There is no pleural effusion or pneumothorax. No acute fractures are identified. There is a stable thoracolumbar scoliosis. IMPRESSION: No active cardiopulmonary process.  Scoliosis. Electronically Signed   By: Carey Bullocks M.D.   On: 06/20/2017 13:29   Pelvis Portable  Result Date: 06/21/2017 CLINICAL DATA:  S/p new total hip arthroplasty on left side today, image done in recovery EXAM: PORTABLE PELVIS 1-2 VIEWS COMPARISON:  CT of the abdomen and pelvis on 11/18/2004 FINDINGS: Patient has undergone LEFT total hip arthroplasty. The hardware appears well seated. No evidence for dislocation on the frontal view performed. Bones appear radiolucent. IMPRESSION: LEFT total hip arthroplasty without adverse features. Electronically Signed   By: Norva Pavlov M.D.   On: 06/21/2017 20:20   Dg C-arm 1-60 Min-no Report  Result Date: 06/21/2017 Fluoroscopy was utilized by the requesting physician.   No radiographic interpretation.   Dg Hip Operative Unilat W Or W/o Pelvis Left  Result Date: 06/21/2017 CLINICAL DATA:  Hip fracture EXAM: OPERATIVE left HIP (WITH PELVIS IF PERFORMED) 6 VIEWS TECHNIQUE: Fluoroscopic spot image(s) were submitted for interpretation post-operatively. COMPARISON:  06/20/2017 FINDINGS: Six low resolution intraoperative views of the left hip. Total fluoroscopy time was 20 seconds. Left femoral neck fracture is demonstrated. Subsequent left hip replacement with normal alignment. IMPRESSION: Intraoperative fluoroscopic assistance provided during left hip replacement Electronically Signed   By: Jasmine Pang M.D.   On: 06/21/2017 19:58   Dg Hip Unilat W Or Wo Pelvis 2-3 Views Left  Result Date: 06/20/2017 CLINICAL DATA:  Left hip pain since falling 4 days ago. EXAM: DG HIP (WITH OR WITHOUT PELVIS) 2-3V LEFT COMPARISON:  None. FINDINGS: The bones are demineralized. There is an impacted subcapital fracture of the left femoral neck. No other fractures are demonstrated. The femoral heads are located. Mild degenerative changes are present in the lower lumbar spine. IMPRESSION: Impacted subcapital fracture of the left femoral neck. Electronically Signed   By: Carey Bullocks M.D.   On: 06/20/2017 13:28        Scheduled Meds: . apixaban  2.5 mg Oral BID  . calcium-vitamin D  1 tablet Oral BID  . cyanocobalamin  1,000 mcg Intramuscular Daily  . docusate sodium  100 mg Oral Daily  . donepezil  10 mg Oral QHS  . feeding supplement (ENSURE ENLIVE)  237 mL Oral TID BM  . insulin aspart  0-9 Units Subcutaneous Once  . insulin aspart  0-9 Units Subcutaneous TID AC & HS  . LORazepam  0.25 mg Oral BID  . mometasone-formoterol  2 puff Inhalation BID  . potassium chloride  40 mEq Oral Once  . senna  1 tablet Oral BID  . sertraline  37.5 mg Oral Daily   Continuous Infusions: . sodium chloride 100 mL/hr at 06/22/17 0834  . methocarbamol (ROBAXIN)  IV       LOS: 2 days     Time spent: 35 mins    Ramiro Harvest, MD [facility] Pager [phone]  If 7PM-7AM, please contact night-coverage [email or web address] Password [facility] 06/22/2017, 12:03 PM

## 2017-06-22 NOTE — Care Plan (Signed)
Patient included in BPCI bundle following L THA on 06-21-17.  [facility].   DCP:  Return to ALF vs SNF depending on progress with PT/OT in hospital.

## 2017-06-22 NOTE — Plan of Care (Signed)
Care plan  

## 2017-06-23 DIAGNOSIS — E876 Hypokalemia: Secondary | ICD-10-CM

## 2017-06-23 LAB — GLUCOSE, CAPILLARY
Glucose-Capillary: 91 mg/dL (ref 65–99)
Glucose-Capillary: 132 mg/dL — ABNORMAL HIGH (ref 65–99)
Glucose-Capillary: 116 mg/dL — ABNORMAL HIGH (ref 65–99)

## 2017-06-23 LAB — MAGNESIUM: Magnesium: 1.7 mg/dL (ref 1.7–2.4)

## 2017-06-23 LAB — CBC
HEMATOCRIT: 22.6 % — AB (ref 36.0–46.0)
Hemoglobin: 7.6 g/dL — ABNORMAL LOW (ref 12.0–15.0)
MCH: 32.2 pg (ref 26.0–34.0)
MCHC: 33.6 g/dL (ref 30.0–36.0)
MCV: 95.8 fL (ref 78.0–100.0)
Platelets: 313 10*3/uL (ref 150–400)
RBC: 2.36 MIL/uL — AB (ref 3.87–5.11)
RDW: 14.5 % (ref 11.5–15.5)
WBC: 7.5 10*3/uL (ref 4.0–10.5)

## 2017-06-23 LAB — PREPARE RBC (CROSSMATCH)

## 2017-06-23 LAB — BASIC METABOLIC PANEL
Anion gap: 5 (ref 5–15)
BUN: 11 mg/dL (ref 6–20)
CO2: 26 mmol/L (ref 22–32)
Calcium: 7.7 mg/dL — ABNORMAL LOW (ref 8.9–10.3)
Chloride: 110 mmol/L (ref 101–111)
Creatinine, Ser: 0.43 mg/dL — ABNORMAL LOW (ref 0.44–1.00)
GFR calc Af Amer: >60 mL/min (ref >60)
GLUCOSE: 108 mg/dL — AB (ref 65–99)
POTASSIUM: 3.2 mmol/L — AB (ref 3.5–5.1)
Sodium: 141 mmol/L (ref 135–145)

## 2017-06-23 MED ORDER — POTASSIUM CHLORIDE 20 MEQ PO PACK
40.0000 meq | PACK | ORAL | Status: AC
  Administered 2017-06-23 (×2): 40 meq via ORAL
  Filled 2017-06-23 (×2): qty 2

## 2017-06-23 MED ORDER — DIPHENHYDRAMINE HCL 25 MG PO CAPS
25.0000 mg | ORAL_CAPSULE | Freq: Once | ORAL | Status: AC
  Administered 2017-06-23: 10:00:00 25 mg via ORAL
  Filled 2017-06-23: qty 1

## 2017-06-23 MED ORDER — FUROSEMIDE 10 MG/ML IJ SOLN
20.0000 mg | Freq: Once | INTRAMUSCULAR | Status: AC
  Administered 2017-06-23: 20 mg via INTRAVENOUS
  Filled 2017-06-23: qty 2

## 2017-06-23 MED ORDER — SODIUM CHLORIDE 0.9 % IV SOLN
Freq: Once | INTRAVENOUS | Status: AC
  Administered 2017-06-23: 10:00:00 via INTRAVENOUS

## 2017-06-23 MED ORDER — ACETAMINOPHEN 325 MG PO TABS
650.0000 mg | ORAL_TABLET | Freq: Once | ORAL | Status: AC
  Administered 2017-06-23: 650 mg via ORAL
  Filled 2017-06-23: qty 2

## 2017-06-23 MED ORDER — SODIUM CHLORIDE 0.9 % IV SOLN: Freq: Once | INTRAVENOUS | Status: DC

## 2017-06-23 MED ORDER — MAGNESIUM SULFATE 4 GM/100ML IV SOLN
4.0000 g | Freq: Once | INTRAVENOUS | Status: AC
  Administered 2017-06-23: 4 g via INTRAVENOUS
  Filled 2017-06-23: qty 100

## 2017-06-23 NOTE — Progress Notes (Signed)
   Subjective: 2 Days Post-Op Procedure(s) (LRB): TOTAL HIP ARTHROPLASTY ANTERIOR APPROACH LEFT (Left)  C/o moderate soreness in the left hip Therapy going fair at this point Denies any new symptoms or issues Patient reports pain as moderate.  Objective:   VITALS:   Vitals:   06/22/17 2130 06/23/17 0440  BP: (!) 109/40 (!) 107/50  Pulse: 72 64  Resp: 16 16  Temp: 98.3 F (36.8 C) 98.3 F (36.8 C)  SpO2: 100% 99%    Left hip incision healing well nv intact distally No rashes or edema distally  LABS Recent Labs    06/21/17 0544 06/22/17 0600 06/23/17 0523  HGB 10.6* 8.9* 7.6*  HCT 32.3* 26.2* 22.6*  WBC 6.8 8.2 7.5  PLT 376 351 313    Recent Labs    06/21/17 0544 06/22/17 0600 06/23/17 0523  NA 142 139 141  K 3.4* 3.6 3.2*  BUN 9 12 11   CREATININE 0.51 0.54 0.43*  GLUCOSE 95 135* 108*     Assessment/Plan: 2 Days Post-Op Procedure(s) (LRB): TOTAL HIP ARTHROPLASTY ANTERIOR APPROACH LEFT (Left) Acute blood loss anemia - recommend transfusion will defer to medical team Continue with therapy as able Pain management Pulmonary toilet    Brad Antonieta Ibaixon PA-C, MPAS [facility] now [facility]  [facility] [address], [place], [place] [address] Phone: [phone] [email or web address] Facebook  [employer]  LinkedIn  Twitter

## 2017-06-23 NOTE — Progress Notes (Signed)
Pt refused lab draw for type and screen and states she is not going to take a blood transfusion.  Dr Janee Mornhompson made aware.

## 2017-06-23 NOTE — Progress Notes (Signed)
PROGRESS NOTE    Colleen Aguilar  LKG:[id number] DOB: November 03, 1945 DOA: 06/20/2017 PCP: No primary care provider on file.    Brief Narrative:  Patient is 72 year old female history of intellectual disability, asthma, type 2 diabetes on oral medications, probable dementia, osteoporosis, on chronic anticoagulation presenting after mechanical fall and found to have an impacted subcapital left femoral neck fracture.  Patient noted not to be a reliable historian on admission.  Per notes and documentation in chart is noted that patient had a fall 4 days prior to admission and noted to be limping by nursing staff at the facility.  Plain films done showed a femoral neck fracture.  Patient sent to the ED and admitted.  Patient seen by orthopedics.  Patient to the OR 06/21/2017 for repair.   Assessment & Plan:   Principal Problem:   Hip fracture requiring operative repair, left, closed, initial encounter [facility](HCC) Active Problems:   Delay in development   UNSPECIFIED MENTAL RETARDATION   Asthma   Acute venous embolism and thrombosis of deep vessels of proximal lower extremity (HCC)   DM (diabetes mellitus), type 2 (HCC)   Left displaced femoral neck fracture (HCC)   Displaced fracture of left femoral neck (HCC)   Vitamin B12 deficiency   Postoperative anemia due to acute blood loss   #1 impacted subcapital left femoral neck fracture Secondary to mechanical fall.  Patient has been seen in consultation by orthopedics and s/p left total hip arthroplasty, anterior approach per Dr. Linna CapriceSwinteck 06/21/2017.  Patient started on low-dose Eliquis for DVT prophylaxis per orthopedics. Per orthopedics.  2.  Asthma Currently stable.  Continue Symbicort, nebs as needed.  Follow.  3.  Diabetes mellitus type 2 CBGs have ranged from 91-132.  Hemoglobin A1c pending.  Continue sliding scale insulin.  Hold oral hypoglycemic agents and resume on d/c.  4.  Developmental delay/unspecified mental retardation  5.  Long-term  anticoagulation Patient noted to be on Eliquis prior to admission.  Not sure what patient was on anticoagulation for.  Eliquis resumed at a lower dose per orthopedics. Follow.  6.  Osteoporosis Aldendronate on hold.  Continue calcium with vitamin D.  7.?  Dementia Continue Aricept.  8.  Psychiatry Continue Ativan and sertraline.  Follow.  9.  Hypokalemia/hypomagnesemia Replete.  Keep magnesium greater than 2.  10 postop anemia/vitamin B12 deficiency Follow H&H.  Hemoglobin currently at 7.6 from 12.2 on admission.  Patient denies any overt GI bleed.  Will transfuse 2 units packed red blood cells as patient symptomatic complaint of dizziness when sitting on the commode.  Patient also noted to have soft blood pressure.  Continue vitamin B12 1000 MCG's IM daily times 7 days, and then weekly times 1 month and then monthly.   DVT prophylaxis: Eliquis Code Status: Full Family Communication: Updated patient no family at bedside. Disposition Plan: Likely skilled nursing facility pending fracture repair.   Consultants:   Orthopedics: Dr. Ranell PatrickNorris 06/20/2017  Procedures:   Chest x-ray 06/20/2017  Plain films of the left hip and pelvis 06/20/2017 Left total hip arthroplasty, anterior approach--- per Dr. Linna CapriceSwinteck 06/21/2017. Transfuse 2 units packed red blood cells 06/23/2017     Antimicrobials:   None   Subjective: Patient in bed.  Complaining of some dizziness when sitting on the commode.  Complaining of some pain in her hip.  Discussed need for transfusion and patient states she will take the transfusion now.    Objective: Vitals:   06/22/17 2130 06/23/17 0440 06/23/17 1022 06/23/17 1446  BP: (!) 109/40 (!) 107/50  (!) 104/52  Pulse: 72 64  74  Resp: 16 16  15   Temp: 98.3 F (36.8 C) 98.3 F (36.8 C)  98.6 F (37 C)  TempSrc: Oral Oral  Oral  SpO2: 100% 99% 97% 97%  Weight:      Height:        Intake/Output Summary (Last 24 hours) at 06/23/2017 1633 Last data filed at  06/23/2017 1448 Gross per 24 hour  Intake 3154 ml  Output 1325 ml  Net 1829 ml   Filed Weights   06/21/17 2200  Weight: 45.4 kg (100 lb 1.4 oz)    Examination:  General exam: Frail.  Cachectic. Frail. Respiratory system: Lungs clear to auscultation bilaterally.  No wheezes, no crackles, no rhonchi. Respiratory effort normal. Cardiovascular system: RRR, no murmurs rubs or gallops.  No JVD.  No lower extremity edema.  Gastrointestinal system: Abdomen is nontender, nondistended, soft, positive bowel sounds.  No rebound.  No guarding.  Central nervous system: Alert and oriented. No focal neurological deficits. Extremities: Symmetric 5 x 5 power. Skin: No rashes, lesions or ulcers Psychiatry: Judgement and insight appear normal. Mood & affect appropriate.     Data Reviewed: I have personally reviewed following labs and imaging studies  CBC: Recent Labs  Lab 06/20/17 1439 06/21/17 0544 06/22/17 0600 06/23/17 0523  WBC 6.4 6.8 8.2 7.5  NEUTROABS 5.0  --  6.8  --   HGB 12.2 10.6* 8.9* 7.6*  HCT 37.0 32.3* 26.2* 22.6*  MCV 95.4 95.3 94.9 95.8  PLT 428* 376 351 313   Basic Metabolic Panel: Recent Labs  Lab 06/20/17 1439 06/21/17 0544 06/22/17 0600 06/23/17 0523 06/23/17 0931  NA 139 142 139 141  --   K 3.7 3.4* 3.6 3.2*  --   CL 101 107 108 110  --   CO2 28 24 24 26   --   GLUCOSE 117* 95 135* 108*  --   BUN 10 9 12 11   --   CREATININE 0.50 0.51 0.54 0.43*  --   CALCIUM 9.4 8.5* 7.8* 7.7*  --   MG  --  1.6* 2.3  --  1.7   GFR: Estimated Creatinine Clearance: 46.2 mL/min (A) (by C-G formula based on SCr of 0.43 mg/dL (L)). Liver Function Tests: No results for input(s): AST, ALT, ALKPHOS, BILITOT, PROT, ALBUMIN in the last 168 hours. No results for input(s): LIPASE, AMYLASE in the last 168 hours. No results for input(s): AMMONIA in the last 168 hours. Coagulation Profile: Recent Labs  Lab 06/20/17 1439  INR 0.90   Cardiac Enzymes: No results for input(s):  CKTOTAL, CKMB, CKMBINDEX, TROPONINI in the last 168 hours. BNP (last 3 results) No results for input(s): PROBNP in the last 8760 hours. HbA1C: No results for input(s): HGBA1C in the last 72 hours. CBG: Recent Labs  Lab 06/22/17 1210 06/22/17 1654 06/22/17 2158 06/23/17 0721 06/23/17 1205  GLUCAP 109* 121* 128* 91 132*   Lipid Profile: No results for input(s): CHOL, HDL, LDLCALC, TRIG, CHOLHDL, LDLDIRECT in the last 72 hours. Thyroid Function Tests: No results for input(s): TSH, T4TOTAL, FREET4, T3FREE, THYROIDAB in the last 72 hours. Anemia Panel: Recent Labs    06/21/17 0929  VITAMINB12 168*  FOLATE 9.4  FERRITIN 35  TIBC 319  IRON 46   Sepsis Labs: No results for input(s): PROCALCITON, LATICACIDVEN in the last 168 hours.  Recent Results (from the past 240 hour(s))  Surgical pcr screen     Status:  None   Collection Time: 06/21/17 12:16 PM  Result Value Ref Range Status   MRSA, PCR NEGATIVE NEGATIVE Final   Staphylococcus aureus NEGATIVE NEGATIVE Final    Comment: (NOTE) The Xpert SA Assay (FDA approved for NASAL specimens in patients 61 years of age and older), is one component of a comprehensive surveillance program. It is not intended to diagnose infection nor to guide or monitor treatment. Performed at [facility], [address], [place], [place] [address]          Radiology Studies: Pelvis Portable  Result Date: 06/21/2017 CLINICAL DATA:  S/p new total hip arthroplasty on left side today, image done in recovery EXAM: PORTABLE PELVIS 1-2 VIEWS COMPARISON:  CT of the abdomen and pelvis on 11/18/2004 FINDINGS: Patient has undergone LEFT total hip arthroplasty. The hardware appears well seated. No evidence for dislocation on the frontal view performed. Bones appear radiolucent. IMPRESSION: LEFT total hip arthroplasty without adverse features. Electronically Signed   By: Norva Pavlov M.D.   On: 06/21/2017 20:20   Dg C-arm 1-60 Min-no  Report  Result Date: 06/21/2017 Fluoroscopy was utilized by the requesting physician.  No radiographic interpretation.   Dg Hip Operative Unilat W Or W/o Pelvis Left  Result Date: 06/21/2017 CLINICAL DATA:  Hip fracture EXAM: OPERATIVE left HIP (WITH PELVIS IF PERFORMED) 6 VIEWS TECHNIQUE: Fluoroscopic spot image(s) were submitted for interpretation post-operatively. COMPARISON:  06/20/2017 FINDINGS: Six low resolution intraoperative views of the left hip. Total fluoroscopy time was 20 seconds. Left femoral neck fracture is demonstrated. Subsequent left hip replacement with normal alignment. IMPRESSION: Intraoperative fluoroscopic assistance provided during left hip replacement Electronically Signed   By: Jasmine Pang M.D.   On: 06/21/2017 19:58        Scheduled Meds: . apixaban  2.5 mg Oral BID  . calcium-vitamin D  1 tablet Oral BID  . cyanocobalamin  1,000 mcg Intramuscular Daily  . docusate sodium  100 mg Oral Daily  . donepezil  10 mg Oral QHS  . feeding supplement (ENSURE ENLIVE)  237 mL Oral TID BM  . furosemide  20 mg Intravenous Once  . insulin aspart  0-9 Units Subcutaneous Once  . insulin aspart  0-9 Units Subcutaneous TID AC & HS  . LORazepam  0.25 mg Oral BID  . mometasone-formoterol  2 puff Inhalation BID  . potassium chloride  40 mEq Oral Once  . senna  1 tablet Oral BID  . sertraline  37.5 mg Oral Daily   Continuous Infusions: . magnesium sulfate 1 - 4 g bolus IVPB 4 g (06/23/17 1509)  . methocarbamol (ROBAXIN)  IV       LOS: 3 days    Time spent: 35 mins    Ramiro Harvest, MD [facility] Pager [phone]  If 7PM-7AM, please contact night-coverage [email or web address] Password [facility] 06/23/2017, 4:33 PM

## 2017-06-24 LAB — CBC
HCT: 35.3 % — ABNORMAL LOW (ref 36.0–46.0)
Hemoglobin: 11.7 g/dL — ABNORMAL LOW (ref 12.0–15.0)
MCH: 29.9 pg (ref 26.0–34.0)
MCHC: 33.1 g/dL (ref 30.0–36.0)
MCV: 90.3 fL (ref 78.0–100.0)
Platelets: 346 10*3/uL (ref 150–400)
RBC: 3.91 MIL/uL (ref 3.87–5.11)
RDW: 16.9 % — ABNORMAL HIGH (ref 11.5–15.5)
WBC: 7.6 10*3/uL (ref 4.0–10.5)

## 2017-06-24 LAB — BASIC METABOLIC PANEL
Anion gap: 7 (ref 5–15)
BUN: 14 mg/dL (ref 6–20)
CO2: 29 mmol/L (ref 22–32)
Calcium: 8.3 mg/dL — ABNORMAL LOW (ref 8.9–10.3)
Chloride: 105 mmol/L (ref 101–111)
Creatinine, Ser: 0.51 mg/dL (ref 0.44–1.00)
GFR calc Af Amer: >60 mL/min (ref >60)
GFR calc non Af Amer: >60 mL/min (ref >60)
Glucose, Bld: 103 mg/dL — ABNORMAL HIGH (ref 65–99)
Potassium: 3.9 mmol/L (ref 3.5–5.1)
Sodium: 141 mmol/L (ref 135–145)

## 2017-06-24 LAB — MAGNESIUM: Magnesium: 2.3 mg/dL (ref 1.7–2.4)

## 2017-06-24 LAB — GLUCOSE, CAPILLARY
Glucose-Capillary: 147 mg/dL — ABNORMAL HIGH (ref 65–99)
Glucose-Capillary: 100 mg/dL — ABNORMAL HIGH (ref 65–99)
Glucose-Capillary: 112 mg/dL — ABNORMAL HIGH (ref 65–99)
Glucose-Capillary: 109 mg/dL — ABNORMAL HIGH (ref 65–99)
Glucose-Capillary: 111 mg/dL — ABNORMAL HIGH (ref 65–99)

## 2017-06-24 MED ORDER — LORATADINE 10 MG PO TABS
10.0000 mg | ORAL_TABLET | Freq: Every day | ORAL | Status: DC
  Administered 2017-06-24 – 2017-06-25 (×2): 10 mg via ORAL
  Filled 2017-06-24: qty 1

## 2017-06-24 MED ORDER — FLUTICASONE PROPIONATE 50 MCG/ACT NA SUSP
2.0000 | Freq: Every day | NASAL | Status: DC
Start: 2017-06-24 — End: 2017-06-25
  Administered 2017-06-24 – 2017-06-25 (×2): 2 via NASAL
  Filled 2017-06-24: qty 16

## 2017-06-24 NOTE — Plan of Care (Signed)
Tolerated two units of blood tonight.   [facility] with [facility].

## 2017-06-24 NOTE — Progress Notes (Signed)
PROGRESS NOTE    Colleen Aguilar  NWG:[id number] DOB: 1945/12/30 DOA: 06/20/2017 PCP: No primary care provider on file.    Brief Narrative:  Patient is 72 year old female history of intellectual disability, asthma, type 2 diabetes on oral medications, probable dementia, osteoporosis, on chronic anticoagulation presenting after mechanical fall and found to have an impacted subcapital left femoral neck fracture.  Patient noted not to be a reliable historian on admission.  Per notes and documentation in chart is noted that patient had a fall 4 days prior to admission and noted to be limping by nursing staff at the facility.  Plain films done showed a femoral neck fracture.  Patient sent to the ED and admitted.  Patient seen by orthopedics.  Patient to the OR 06/21/2017 for repair.   Assessment & Plan:   Principal Problem:   Hip fracture requiring operative repair, left, closed, initial encounter [facility](HCC) Active Problems:   Delay in development   UNSPECIFIED MENTAL RETARDATION   Asthma   Acute venous embolism and thrombosis of deep vessels of proximal lower extremity (HCC)   DM (diabetes mellitus), type 2 (HCC)   Left displaced femoral neck fracture (HCC)   Displaced fracture of left femoral neck (HCC)   Vitamin B12 deficiency   Postoperative anemia due to acute blood loss   #1 impacted subcapital left femoral neck fracture Secondary to mechanical fall.  Patient has been seen in consultation by orthopedics and s/p left total hip arthroplasty, anterior approach per Dr. Linna CapriceSwinteck 06/21/2017.  Patient started on low-dose Eliquis for DVT prophylaxis per orthopedics.  PT/OT.  Patient needing skilled nursing facility.  Per orthopedics.  2.  Asthma Stable.  Symbicort as needed.   3.  Diabetes mellitus type 2 CBGs have ranged from 100-147.  Hemoglobin A1c pending.  Continue sliding scale insulin.  Hold oral hypoglycemic agents and resume on d/c.  4.  Developmental delay/unspecified mental  retardation  5.  Long-term anticoagulation Patient noted to be on Eliquis prior to admission.  Not sure what patient was on anticoagulation for.  Eliquis resumed at a lower dose per orthopedics. Follow.  6.  Osteoporosis Aldendronate on hold and will resume on discharge.  Continue calcium with vitamin D.  7.?  Dementia Stable.  Aricept.   8.  Psychiatry Continue Ativan and sertraline.  Follow.  9.  Hypokalemia/hypomagnesemia Replete.  Keep magnesium greater than 2.  10 postop anemia/vitamin B12 deficiency Hemoglobin currently at 11.7 from 7.6 from 12.2 on admission.  Patient status post 2 units packed red blood cells 06/23/2017.  Dizziness improved posttransfusion.  Continue vitamin B12 1000 MCG's IM daily times 7 days, and then weekly times 1 month and then monthly.  11.  Bilateral ear pain No signs of ear infection.  Patient denies any ear ringing.  Patient states occur when she is eating food.  Patient also noted to have some sinus congestion.  Place on Claritin and Flonase for now.  Follow.   DVT prophylaxis: Eliquis Code Status: Full Family Communication: Updated patient no family at bedside. Disposition Plan: Likely skilled nursing facility when okay with orthopedics likely in the next 24-48 hours.    Consultants:   Orthopedics: Dr. Ranell PatrickNorris 06/20/2017  Procedures:   Chest x-ray 06/20/2017  Plain films of the left hip and pelvis 06/20/2017 Left total hip arthroplasty, anterior approach--- per Dr. Linna CapriceSwinteck 06/21/2017. Transfuse 2 units packed red blood cells 06/23/2017     Antimicrobials:   None   Subjective: Patient sitting up in bed.  States dizziness  improved after transfusion of packed red blood cells.  No chest pain.  No shortness of breath.  Hip pain controlled on current pain regimen.  Complaining of intermittent ear pain when she is eating.  Patient with some nasal congestion.     Objective: Vitals:   06/23/17 2231 06/24/17 0146 06/24/17 0525 06/24/17 1309  BP:  109/69 (!) 111/49 (!) 107/52 114/61  Pulse: 65 60 61 69  Resp: 16 16 15 16   Temp: 98.5 F (36.9 C) 98.5 F (36.9 C) (!) 97.4 F (36.3 C) 97.7 F (36.5 C)  TempSrc: Oral Oral Oral Oral  SpO2: 100% 100% 99% 97%  Weight:      Height:        Intake/Output Summary (Last 24 hours) at 06/24/2017 1434 Last data filed at 06/24/2017 1257 Gross per 24 hour  Intake 2314 ml  Output 600 ml  Net 1714 ml   Filed Weights   06/21/17 2200  Weight: 45.4 kg (100 lb 1.4 oz)    Examination:  General exam: Frail.  Cachectic. Frail. Respiratory system: Lungs clear to auscultation bilaterally with no crackles, no rhonchi, no wheezing.  Normal respiratory effort. Respiratory effort normal. Cardiovascular system: Regular rate rhythm no murmurs rubs or gallops.  No JVD.  No lower extremity edema.   Gastrointestinal system: Abdomen is soft, nontender, nondistended, positive bowel sounds.  No guarding.  No rebound.  Central nervous system: Alert and oriented. No focal neurological deficits. Extremities: Symmetric 5 x 5 power. Skin: No rashes, lesions or ulcers Psychiatry: Judgement and insight appear normal. Mood & affect appropriate.     Data Reviewed: I have personally reviewed following labs and imaging studies  CBC: Recent Labs  Lab 06/20/17 1439 06/21/17 0544 06/22/17 0600 06/23/17 0523 06/24/17 0551  WBC 6.4 6.8 8.2 7.5 7.6  NEUTROABS 5.0  --  6.8  --   --   HGB 12.2 10.6* 8.9* 7.6* 11.7*  HCT 37.0 32.3* 26.2* 22.6* 35.3*  MCV 95.4 95.3 94.9 95.8 90.3  PLT 428* 376 351 313 346   Basic Metabolic Panel: Recent Labs  Lab 06/20/17 1439 06/21/17 0544 06/22/17 0600 06/23/17 0523 06/23/17 0931 06/24/17 0551  NA 139 142 139 141  --  141  K 3.7 3.4* 3.6 3.2*  --  3.9  CL 101 107 108 110  --  105  CO2 28 24 24 26   --  29  GLUCOSE 117* 95 135* 108*  --  103*  BUN 10 9 12 11   --  14  CREATININE 0.50 0.51 0.54 0.43*  --  0.51  CALCIUM 9.4 8.5* 7.8* 7.7*  --  8.3*  MG  --  1.6* 2.3   --  1.7 2.3   GFR: Estimated Creatinine Clearance: 46.2 mL/min (by C-G formula based on SCr of 0.51 mg/dL). Liver Function Tests: No results for input(s): AST, ALT, ALKPHOS, BILITOT, PROT, ALBUMIN in the last 168 hours. No results for input(s): LIPASE, AMYLASE in the last 168 hours. No results for input(s): AMMONIA in the last 168 hours. Coagulation Profile: Recent Labs  Lab 06/20/17 1439  INR 0.90   Cardiac Enzymes: No results for input(s): CKTOTAL, CKMB, CKMBINDEX, TROPONINI in the last 168 hours. BNP (last 3 results) No results for input(s): PROBNP in the last 8760 hours. HbA1C: No results for input(s): HGBA1C in the last 72 hours. CBG: Recent Labs  Lab 06/23/17 1205 06/23/17 1736 06/23/17 2243 06/24/17 0732 06/24/17 1138  GLUCAP 132* 116* 147* 100* 112*   Lipid Profile: No  results for input(s): CHOL, HDL, LDLCALC, TRIG, CHOLHDL, LDLDIRECT in the last 72 hours. Thyroid Function Tests: No results for input(s): TSH, T4TOTAL, FREET4, T3FREE, THYROIDAB in the last 72 hours. Anemia Panel: No results for input(s): VITAMINB12, FOLATE, FERRITIN, TIBC, IRON, RETICCTPCT in the last 72 hours. Sepsis Labs: No results for input(s): PROCALCITON, LATICACIDVEN in the last 168 hours.  Recent Results (from the past 240 hour(s))  Surgical pcr screen     Status: None   Collection Time: 06/21/17 12:16 PM  Result Value Ref Range Status   MRSA, PCR NEGATIVE NEGATIVE Final   Staphylococcus aureus NEGATIVE NEGATIVE Final    Comment: (NOTE) The Xpert SA Assay (FDA approved for NASAL specimens in patients 41 years of age and older), is one component of a comprehensive surveillance program. It is not intended to diagnose infection nor to guide or monitor treatment. Performed at [facility], [address] [place], [place] [address]          Radiology Studies: No results found.      Scheduled Meds: . apixaban  2.5 mg Oral BID  . calcium-vitamin D  1  tablet Oral BID  . cyanocobalamin  1,000 mcg Intramuscular Daily  . docusate sodium  100 mg Oral Daily  . donepezil  10 mg Oral QHS  . feeding supplement (ENSURE ENLIVE)  237 mL Oral TID BM  . fluticasone  2 spray Each Nare Daily  . insulin aspart  0-9 Units Subcutaneous Once  . insulin aspart  0-9 Units Subcutaneous TID AC & HS  . loratadine  10 mg Oral Daily  . LORazepam  0.25 mg Oral BID  . mometasone-formoterol  2 puff Inhalation BID  . potassium chloride  40 mEq Oral Once  . senna  1 tablet Oral BID  . sertraline  37.5 mg Oral Daily   Continuous Infusions: . sodium chloride    . methocarbamol (ROBAXIN)  IV       LOS: 4 days    Time spent: 35 mins    Ramiro Harvest, MD [facility] Pager [phone]  If 7PM-7AM, please contact night-coverage [email or web address] Password [facility] 06/24/2017, 2:34 PM

## 2017-06-25 DIAGNOSIS — F79 Unspecified intellectual disabilities: Secondary | ICD-10-CM

## 2017-06-25 LAB — CBC WITH DIFFERENTIAL/PLATELET
Basophils Absolute: 0 10*3/uL (ref 0.0–0.1)
Basophils Relative: 0 %
Eosinophils Absolute: 0.3 10*3/uL (ref 0.0–0.7)
Eosinophils Relative: 4 %
HCT: 39.1 % (ref 36.0–46.0)
HEMOGLOBIN: 12.9 g/dL (ref 12.0–15.0)
LYMPHS ABS: 1.6 10*3/uL (ref 0.7–4.0)
LYMPHS PCT: 22 %
MCH: 29.8 pg (ref 26.0–34.0)
MCHC: 33 g/dL (ref 30.0–36.0)
MCV: 90.3 fL (ref 78.0–100.0)
Monocytes Absolute: 0.4 10*3/uL (ref 0.1–1.0)
Monocytes Relative: 6 %
NEUTROS ABS: 5 10*3/uL (ref 1.7–7.7)
NEUTROS PCT: 68 %
Platelets: 436 10*3/uL — ABNORMAL HIGH (ref 150–400)
RBC: 4.33 MIL/uL (ref 3.87–5.11)
RDW: 16.5 % — ABNORMAL HIGH (ref 11.5–15.5)
WBC: 7.3 10*3/uL (ref 4.0–10.5)

## 2017-06-25 LAB — BASIC METABOLIC PANEL
Anion gap: 9 (ref 5–15)
BUN: 11 mg/dL (ref 6–20)
CO2: 28 mmol/L (ref 22–32)
Calcium: 9 mg/dL (ref 8.9–10.3)
Chloride: 102 mmol/L (ref 101–111)
Creatinine, Ser: 0.49 mg/dL (ref 0.44–1.00)
GFR calc Af Amer: >60 mL/min (ref >60)
GLUCOSE: 182 mg/dL — AB (ref 65–99)
POTASSIUM: 3.8 mmol/L (ref 3.5–5.1)
Sodium: 139 mmol/L (ref 135–145)

## 2017-06-25 LAB — TYPE AND SCREEN
ABO/RH(D): B POS
Antibody Screen: NEGATIVE
UNIT DIVISION: 0
Unit division: 0

## 2017-06-25 LAB — BPAM RBC
BLOOD PRODUCT EXPIRATION DATE: 201904062359
Blood Product Expiration Date: 201903312359
ISSUE DATE / TIME: 201903091849
ISSUE DATE / TIME: 201903092141
UNIT TYPE AND RH: 7300
Unit Type and Rh: 7300

## 2017-06-25 LAB — HEMOGLOBIN A1C
Hgb A1c MFr Bld: 5.8 % — ABNORMAL HIGH (ref 4.8–5.6)
Mean Plasma Glucose: 120 mg/dL

## 2017-06-25 LAB — GLUCOSE, CAPILLARY
Glucose-Capillary: 101 mg/dL — ABNORMAL HIGH (ref 65–99)
Glucose-Capillary: 133 mg/dL — ABNORMAL HIGH (ref 65–99)

## 2017-06-25 MED ORDER — FLUTICASONE PROPIONATE 50 MCG/ACT NA SUSP: 2.0000 | Freq: Every day | NASAL | 0 refills | Status: AC

## 2017-06-25 MED ORDER — CYANOCOBALAMIN 1000 MCG/ML IJ SOLN: 1000.0000 ug | Freq: Every day | INTRAMUSCULAR | 0 refills | Status: AC

## 2017-06-25 MED ORDER — METHOCARBAMOL 500 MG PO TABS: 500.0000 mg | ORAL_TABLET | Freq: Three times a day (TID) | ORAL | 0 refills | Status: DC | PRN

## 2017-06-25 MED ORDER — MENTHOL 3 MG MT LOZG: 1.0000 | LOZENGE | OROMUCOSAL | 0 refills | Status: AC | PRN

## 2017-06-25 MED ORDER — LORATADINE 10 MG PO TABS: 10.0000 mg | ORAL_TABLET | Freq: Every day | ORAL | 0 refills | Status: AC

## 2017-06-25 MED ORDER — SENNA 8.6 MG PO TABS: 1.0000 | ORAL_TABLET | Freq: Two times a day (BID) | ORAL | 0 refills | Status: AC

## 2017-06-25 MED ORDER — LORAZEPAM 0.5 MG PO TABS: 0.2500 mg | ORAL_TABLET | Freq: Two times a day (BID) | ORAL | 0 refills | Status: AC

## 2017-06-25 NOTE — Anesthesia Postprocedure Evaluation (Signed)
Anesthesia Post Note  Patient: Arnell A Deguire  Procedure(s) Performed: TOTAL HIP ARTHROPLASTY ANTERIOR APPROACH LEFT (Left )     Patient location during evaluation: PACU Anesthesia Type: General Level of consciousness: awake and alert Pain management: pain level controlled Vital Signs Assessment: post-procedure vital signs reviewed and stable Respiratory status: spontaneous breathing, nonlabored ventilation and respiratory function stable Cardiovascular status: blood pressure returned to baseline and stable Postop Assessment: no apparent nausea or vomiting Anesthetic complications: no    Last Vitals:  Vitals:   06/24/17 2112 06/25/17 0503  BP: (!) 124/54 126/66  Pulse: 69 62  Resp: 16 16  Temp: 36.5 C 36.6 C  SpO2: 98% 97%    Last Pain:  Vitals:   06/25/17 0503  TempSrc: Oral  PainSc:                  Alcee Sipos,W. EDMOND

## 2017-06-25 NOTE — Progress Notes (Signed)
Physical Therapy Treatment Patient Details Name: Colleen Aguilar MRN: [id number] DOB: [birth year] Today's Date: 06/25/2017    History of Present Illness 72 yo female s/p fall at facility resulting in L hip fx, pt underwent L DA THA on 06/21/17 per Dr Linna Caprice; PMH:  unspecified MR, DM, asthma    PT Comments    POD # 4 am session Assisted OOB to [facility] required MAX encouragement.  Pt very vocal and set.  Pt has yet to amb, so when she asked to wash her hands after self sitting peri care, I assisted with max encouragement to walk 5 feet to bathroom sink and back to recliner. Positioned in recliner and applied ICE.  Follow Up Recommendations  SNF     Equipment Recommendations  None recommended by PT    Recommendations for Other Services       Precautions / Restrictions Precautions Precautions: Fall Restrictions Weight Bearing Restrictions: No Other Position/Activity Restrictions: WBAT    Mobility  Bed Mobility Overal bed mobility: Needs Assistance Bed Mobility: Supine to Sit     Supine to sit: Mod assist     General bed mobility comments: assist with bil LEs and increased time  Transfers Overall transfer level: Needs assistance Equipment used: None Transfers: Sit to/from [employer] Transfers Sit to Stand: Min assist         General transfer comment: assisted from elevated bed 1/4 pivot to [facility] towards her R and 25% VC's on proper hand transfer.   Ambulation/Gait Ambulation/Gait assistance: Min guard;Min assist Ambulation Distance (Feet): 10 Feet(5 feet x 2 to and from bathroom sink) Assistive device: Rolling walker (2 wheeled) Gait Pattern/deviations: Step-to pattern;Decreased step length - right;Decreased step length - left;Antalgic Gait velocity: decreased   General Gait Details: 50 % VC's on proper walker to self distance and 100% motivation to just walk to the sink and back as pt wanted to wash her hands.   Stairs            Wheelchair Mobility     Modified Rankin (Stroke Patients Only)       Balance                                            Cognition Arousal/Alertness: Awake/alert Behavior During Therapy: WFL for tasks assessed/performed Overall Cognitive Status: History of cognitive impairments - at baseline                                 General Comments: pt very vocal and direct      Exercises      General Comments        Pertinent Vitals/Pain Faces Pain Scale: Hurts even more Pain Location: L hip Pain Descriptors / Indicators: Moaning;Grimacing;Guarding Pain Intervention(s): Monitored during session;Repositioned;Ice applied    Home Living                      Prior Function            PT Goals (current goals can now be found in the care plan section) Progress towards PT goals: Progressing toward goals    Frequency    Min 3X/week      PT Plan Current plan remains appropriate    Co-evaluation  AM-PAC PT "6 Clicks" Daily Activity  Outcome Measure  Difficulty turning over in bed (including adjusting bedclothes, sheets and blankets)?: A Lot Difficulty moving from lying on back to sitting on the side of the bed? : A Lot Difficulty sitting down on and standing up from a chair with arms (e.g., wheelchair, bedside commode, etc,.)?: A Lot Help needed moving to and from a bed to chair (including a wheelchair)?: A Lot Help needed walking in hospital room?: A Lot Help needed climbing 3-5 steps with a railing? : A Lot 6 Click Score: 12    End of Session Equipment Utilized During Treatment: Gait belt Activity Tolerance: Other (comment)(pt self limiting "I can't") Patient left: in chair;with call bell/phone within reach Nurse Communication: Mobility status PT Visit Diagnosis: Unsteadiness on feet (R26.81);Difficulty in walking, not elsewhere classified (R26.2)     Time: [id number] PT Time Calculation (min) (ACUTE ONLY): 28  min  Charges:  $Gait Training: 8-22 mins $Therapeutic Activity: 8-22 mins                    G Codes:       Felecia ShellingLori Azaan Leask  PTA [facility]  Acute  Rehab Pager      [phone]

## 2017-06-25 NOTE — Discharge Summary (Signed)
Physician Discharge Summary  Phallon Vonna Kotyk XBJ:[id number] DOB: 02-21-1946 DOA: 06/20/2017  PCP: No primary care provider on file.  Admit date: 06/20/2017 Discharge date: 06/25/2017  Time spent: 55 minutes  Recommendations for Outpatient Follow-up:  1. Follow-up with Dr. Linna Caprice orthopedics in 2 weeks. 2. Follow-up with MD at skilled nursing facility.   Discharge Diagnoses:  Principal Problem:   Hip fracture requiring operative repair, left, closed, initial encounter [facility]) Active Problems:   Delay in development   UNSPECIFIED MENTAL RETARDATION   Asthma   Acute venous embolism and thrombosis of deep vessels of proximal lower extremity (HCC)   DM (diabetes mellitus), type 2 (HCC)   Left displaced femoral neck fracture (HCC)   Displaced fracture of left femoral neck (HCC)   Vitamin B12 deficiency   Postoperative anemia due to acute blood loss   Discharge Condition: Stable and improved  Diet recommendation: Carb modified diet  Filed Weights   06/21/17 2200  Weight: 45.4 kg (100 lb 1.4 oz)    History of present illness:  Per Dr. Clearnce Sorrel Katreena A Kannan is a 72 y.o. female with medical history significant of intellectual disability, asthma, psychogenic dysphagia, type 2 diabetes on oral medications, possible dementia, osteoporosis and unclear apixaban use who comes in after mechanical fall found to have impacted subcapital left femoral neck fracture.  Patient was an unreliable historian due to her underlying intellectual disability.  When asked she says that she was at dinner and tripped over a step and had a mechanical fall.  Per the notes and documentation of the chart patient was noted to have a fall approximately 4 days prior to admission, and noted to be limping by nursing staff at assisted living facility.  An x-ray was taken and showed this fracture.  Patient denied any chest pain, palpitations, nausea, vomiting, diarrhea.  However the review of systems was unclear as she reported in  her medical history a diagnosis of cancer and none could be found.  ED Course: In the ED vital signs were stable.  X-ray showed impacted subcapital fracture of the left femoral neck.  Orthopedics was consulted    Hospital Course:  #1 impacted subcapital left femoral neck fracture Secondary to mechanical fall.  Patient has been seen in consultation by orthopedics and s/p left total hip arthroplasty, anterior approach per Dr. Linna Caprice 06/21/2017.  Patient started on low-dose Eliquis for DVT prophylaxis per orthopedics.  PT/OT.  Patient will be discharged to a skilled nursing facility.  Patient will follow up with orthopedics in the outpatient setting.   2.  Asthma Stable.  Maintained on home regimen Symbicort during the hospitalization.  3.  Diabetes mellitus type 2 Hemoglobin A1c was ordered however was pending by time of discharge.  Patient's oral hypoglycemic agents were held during the hospitalization and patient was managed on sliding scale insulin.  Patient will be discharged back on home regimen of oral hypoglycemic agents.  Outpatient follow-up.  4.  Developmental delay/unspecified mental retardation  5.  Long-term anticoagulation Patient noted to be on Eliquis prior to admission.  Not sure what patient was on anticoagulation for.  Eliquis resumed at a lower dose per orthopedics and dose subsequently increased back to home regimen on day of discharge.  6.  Osteoporosis Aldendronate was held during the hospitalization and will be resumed on discharge.  Patient was maintained on calcium and vitamin D during the hospitalization.  Outpatient follow-up.   7.?  Dementia Stable.  Was maintained on home regimen of Aricept.  8.  Psychiatry Continued on home regimen of Ativan and sertraline.   9.  Hypokalemia/hypomagnesemia Repleted.    10 postop anemia/vitamin B12 deficiency Patient noted to have a postop anemia during the hospitalization and hemoglobin went as low as 7.6 from  12.2 on admission.  Patient was symptomatic with his low hemoglobin and subsequently transfused 2 units packed red blood cells.  Hemoglobin stabilized at 12.9 by day of discharge.  Anemia panel done was also consistent with a vitamin B12 deficiency and as such patient was placed on vitamin B12 1000 MCG's IM supplementation.  Outpatient follow-up.   11.  Bilateral ear pain No signs of ear infection.  Patient denied any ear ringing.  Patient stated occurred when she is eating food.  Patient also noted to have some sinus congestion.  Placed on Claritin and Flonase with clinical improvement.  Outpatient follow-up.       Procedures:  Chest x-ray 06/20/2017  Plain films of the left hip and pelvis 06/20/2017 Lefttotal hip arthroplasty, anterior approach--- per Dr. Linna Caprice 06/21/2017. Transfuse 2 units packed red blood cells 06/23/2017      Consultations:  Orthopedics: Dr. Ranell Patrick 06/20/2017      Discharge Exam: Vitals:   06/24/17 2112 06/25/17 0503  BP: (!) 124/54 126/66  Pulse: 69 62  Resp: 16 16  Temp: 97.7 F (36.5 C) 97.9 F (36.6 C)  SpO2: 98% 97%    General: NAD Cardiovascular: RRR Respiratory: CTAB  Discharge Instructions   Discharge Instructions    Diet Carb Modified   Complete by:  As directed    Increase activity slowly   Complete by:  As directed      Allergies as of 06/25/2017   No Known Allergies     Medication List    STOP taking these medications   Fluticasone-Salmeterol 100-50 MCG/DOSE Aepb Commonly known as:  ADVAIR DISKUS     TAKE these medications   acetaminophen 325 MG tablet Commonly known as:  TYLENOL Take 650 mg by mouth every 6 (six) hours as needed. For pain   albuterol 108 (90 Base) MCG/ACT inhaler Commonly known as:  VENTOLIN HFA Inhale 2 puffs into the lungs every 6 (six) hours as needed.   alendronate 35 MG tablet Commonly known as:  FOSAMAX Take 35 mg by mouth every 7 (seven) days. Take with a full glass of water on an empty  stomach.   budesonide-formoterol 160-4.5 MCG/ACT inhaler Commonly known as:  SYMBICORT Inhale 2 puffs into the lungs 2 (two) times daily.   calcium-vitamin D 500-200 MG-UNIT tablet Commonly known as:  OSCAL WITH D Take 1 tablet by mouth 2 (two) times daily.   carboxymethylcellulose 0.5 % Soln Commonly known as:  REFRESH PLUS Place 1 drop into both eyes 3 (three) times daily as needed.   cyanocobalamin 1000 MCG/ML injection Commonly known as:  (VITAMIN B-12) Inject 1 mL (1,000 mcg total) into the muscle daily. 1000 MCG's IM daily times 3 days, then IM weekly times 1 month then 1000 MCG's IM monthly. Start taking on:  06/26/2017   docusate sodium 100 MG capsule Commonly known as:  COLACE Take 100 mg by mouth daily.   donepezil 10 MG tablet Commonly known as:  ARICEPT Take 10 mg by mouth at bedtime.   ELIQUIS 5 MG Tabs tablet Generic drug:  apixaban Take 5 mg by mouth 2 (two) times daily.   fluticasone 50 MCG/ACT nasal spray Commonly known as:  FLONASE Place 2 sprays into both nostrils daily. Start taking  on:  06/26/2017   HYDROcodone-acetaminophen 5-325 MG tablet Commonly known as:  NORCO/VICODIN Take 1-2 tablets by mouth every 4 (four) hours as needed for moderate pain.   loratadine 10 MG tablet Commonly known as:  CLARITIN Take 1 tablet (10 mg total) by mouth daily. Start taking on:  06/26/2017   LORazepam 0.5 MG tablet Commonly known as:  ATIVAN Take 0.5 tablets (0.25 mg total) by mouth 2 (two) times daily.   menthol-cetylpyridinium 3 MG lozenge Commonly known as:  CEPACOL Take 1 lozenge (3 mg total) by mouth as needed for sore throat (sore throat).   metFORMIN 500 MG tablet Commonly known as:  GLUCOPHAGE Take 250 mg by mouth 2 (two) times daily with a meal.   methocarbamol 500 MG tablet Commonly known as:  ROBAXIN Take 1 tablet (500 mg total) by mouth every 8 (eight) hours as needed for muscle spasms.   NUTRITIONAL SHAKE PO Take 1 each by mouth 3  (three) times daily.   senna 8.6 MG Tabs tablet Commonly known as:  SENOKOT Take 1 tablet (8.6 mg total) by mouth 2 (two) times daily.   sertraline 25 MG tablet Commonly known as:  ZOLOFT Take 37.5 mg by mouth daily.      No Known Allergies Follow-up Information    Swinteck, Arlys JohnBrian, MD. Schedule an appointment as soon as possible for a visit in 2 week(s).   Specialty:  Orthopedic Surgery Why:  For wound re-check Contact information: [address] [place] [address] [phone]        MD AT SNF Follow up.            The results of significant diagnostics from this hospitalization (including imaging, microbiology, ancillary and laboratory) are listed below for reference.    Significant Diagnostic Studies: Dg Chest 2 View  Result Date: 06/20/2017 CLINICAL DATA:  Left hip fracture. EXAM: CHEST - 2 VIEW COMPARISON:  08/04/2011 and 12/31/2010. FINDINGS: The heart size and mediastinal contours are stable. The lungs are clear. There is no pleural effusion or pneumothorax. No acute fractures are identified. There is a stable thoracolumbar scoliosis. IMPRESSION: No active cardiopulmonary process.  Scoliosis. Electronically Signed   By: Carey BullocksWilliam  Veazey M.D.   On: 06/20/2017 13:29   Pelvis Portable  Result Date: 06/21/2017 CLINICAL DATA:  S/p new total hip arthroplasty on left side today, image done in recovery EXAM: PORTABLE PELVIS 1-2 VIEWS COMPARISON:  CT of the abdomen and pelvis on 11/18/2004 FINDINGS: Patient has undergone LEFT total hip arthroplasty. The hardware appears well seated. No evidence for dislocation on the frontal view performed. Bones appear radiolucent. IMPRESSION: LEFT total hip arthroplasty without adverse features. Electronically Signed   By: Norva PavlovElizabeth  Brown M.D.   On: 06/21/2017 20:20   Dg C-arm 1-60 Min-no Report  Result Date: 06/21/2017 Fluoroscopy was utilized by the requesting physician.  No radiographic interpretation.   Dg Hip  Operative Unilat W Or W/o Pelvis Left  Result Date: 06/21/2017 CLINICAL DATA:  Hip fracture EXAM: OPERATIVE left HIP (WITH PELVIS IF PERFORMED) 6 VIEWS TECHNIQUE: Fluoroscopic spot image(s) were submitted for interpretation post-operatively. COMPARISON:  06/20/2017 FINDINGS: Six low resolution intraoperative views of the left hip. Total fluoroscopy time was 20 seconds. Left femoral neck fracture is demonstrated. Subsequent left hip replacement with normal alignment. IMPRESSION: Intraoperative fluoroscopic assistance provided during left hip replacement Electronically Signed   By: Jasmine PangKim  Fujinaga M.D.   On: 06/21/2017 19:58   Dg Hip Unilat W Or Wo Pelvis 2-3 Views Left  Result Date: 06/20/2017 CLINICAL DATA:  Left hip pain since falling 4 days ago. EXAM: DG HIP (WITH OR WITHOUT PELVIS) 2-3V LEFT COMPARISON:  None. FINDINGS: The bones are demineralized. There is an impacted subcapital fracture of the left femoral neck. No other fractures are demonstrated. The femoral heads are located. Mild degenerative changes are present in the lower lumbar spine. IMPRESSION: Impacted subcapital fracture of the left femoral neck. Electronically Signed   By: Carey Bullocks M.D.   On: 06/20/2017 13:28    Microbiology: Recent Results (from the past 240 hour(s))  Surgical pcr screen     Status: None   Collection Time: 06/21/17 12:16 PM  Result Value Ref Range Status   MRSA, PCR NEGATIVE NEGATIVE Final   Staphylococcus aureus NEGATIVE NEGATIVE Final    Comment: (NOTE) The Xpert SA Assay (FDA approved for NASAL specimens in patients 52 years of age and older), is one component of a comprehensive surveillance program. It is not intended to diagnose infection nor to guide or monitor treatment. Performed at [facility], [address] [place], [place] [address]      Labs: Basic Metabolic Panel: Recent Labs  Lab 06/21/17 0544 06/22/17 0600 06/23/17 0523 06/23/17 0931 06/24/17 0551  06/25/17 0906  NA 142 139 141  --  141 139  K 3.4* 3.6 3.2*  --  3.9 3.8  CL 107 108 110  --  105 102  CO2 24 24 26   --  29 28  GLUCOSE 95 135* 108*  --  103* 182*  BUN 9 12 11   --  14 11  CREATININE 0.51 0.54 0.43*  --  0.51 0.49  CALCIUM 8.5* 7.8* 7.7*  --  8.3* 9.0  MG 1.6* 2.3  --  1.7 2.3  --    Liver Function Tests: No results for input(s): AST, ALT, ALKPHOS, BILITOT, PROT, ALBUMIN in the last 168 hours. No results for input(s): LIPASE, AMYLASE in the last 168 hours. No results for input(s): AMMONIA in the last 168 hours. CBC: Recent Labs  Lab 06/20/17 1439 06/21/17 0544 06/22/17 0600 06/23/17 0523 06/24/17 0551 06/25/17 0906  WBC 6.4 6.8 8.2 7.5 7.6 7.3  NEUTROABS 5.0  --  6.8  --   --  5.0  HGB 12.2 10.6* 8.9* 7.6* 11.7* 12.9  HCT 37.0 32.3* 26.2* 22.6* 35.3* 39.1  MCV 95.4 95.3 94.9 95.8 90.3 90.3  PLT 428* 376 351 313 346 436*   Cardiac Enzymes: No results for input(s): CKTOTAL, CKMB, CKMBINDEX, TROPONINI in the last 168 hours. BNP: BNP (last 3 results) No results for input(s): BNP in the last 8760 hours.  ProBNP (last 3 results) No results for input(s): PROBNP in the last 8760 hours.  CBG: Recent Labs  Lab 06/24/17 0732 06/24/17 1138 06/24/17 1716 06/24/17 2232 06/25/17 0736  GLUCAP 100* 112* 109* 111* 101*       Signed:  Ramiro Harvest MD.  [facility] 06/25/2017, 10:12 AM

## 2017-06-25 NOTE — Clinical Social Work Placement (Addendum)
D/C Summary sent.  CSW left voicemail for patient brother-Richard. /Richard aware of patient d/c. Facility will accept pt. After 3:00pm  CLINICAL SOCIAL WORK PLACEMENT  NOTE  Date:  06/25/2017  Patient Details  Name: Colleen Aguilar MRN: [id number] Date of Birth: 1945-11-09  Clinical Social Work is seeking post-discharge placement for this patient at the Skilled  Nursing Facility level of care (*CSW will initial, date and re-position this form in  chart as items are completed):  Yes   Patient/family provided with [facility] Clinical Social Work Department's list of facilities offering this level of care within the geographic area requested by the patient (or if unable, by the patient's family).  Yes   Patient/family informed of their freedom to choose among providers that offer the needed level of care, that participate in Medicare, Medicaid or managed care program needed by the patient, have an available bed and are willing to accept the patient.  Yes   Patient/family informed of [facility]'s ownership interest in [facility] and [facility], as well as of the fact that they are under no obligation to receive care at these facilities.  PASRR submitted to EDS on 06/20/17     PASRR number received on 06/20/17     Existing PASRR number confirmed on       FL2 transmitted to all facilities in geographic area requested by pt/family on       FL2 transmitted to all facilities within larger geographic area on 06/25/17     Patient informed that his/her managed care company has contracts with or will negotiate with certain facilities, including the following:  [facility]     Yes   Patient/family informed of bed offers received.  [facility]     Physician recommends and patient chooses bed at      [facility] on 06/25/[facility].  Patient to be transferred to facility by [facility]      Patient family notified on 06/25/17 of  transfer.  Name of family member notified:  Brother-Richard      PHYSICIAN       Additional Comment:    _______________________________________________ Clearance CootsNicole A Emelie Newsom, LCSW 06/25/2017, 10:36 AM

## 2017-06-25 NOTE — Care Management Important Message (Signed)
Important Message  Patient Details  Name: Colleen Aguilar MRN: [id number] Date of Birth: Apr 11, 1946   Medicare Important Message Given:  Yes    [name], [name] 06/25/2017, 11:27 AMImportant Message  Patient Details  Name: Colleen Aguilar MRN: [id number] Date of Birth: Apr 11, 1946   Medicare Important Message Given:  Yes    [name], [name] 06/25/2017, 11:27 AM

## 2017-06-25 NOTE — Plan of Care (Signed)
Care plan  

## 2017-06-25 NOTE — Plan of Care (Signed)
Updated care plan.

## 2017-06-25 NOTE — Progress Notes (Signed)
[facility] [place] [facility]. Any questions were answered regarding the patient's care.   1424 06/25/2017 Greer PickerelShaina Dalen [place], RN

## 2019-02-10 IMAGING — CR DG HIP (WITH OR WITHOUT PELVIS) 2-3V*L*
3 series · 3 of 3 positions shown · non-contrast
Comparison: None.

CLINICAL DATA: Left hip pain since falling 4 days ago.

EXAM:
DG HIP (WITH OR WITHOUT PELVIS) 2-3V LEFT

[x pelvis]
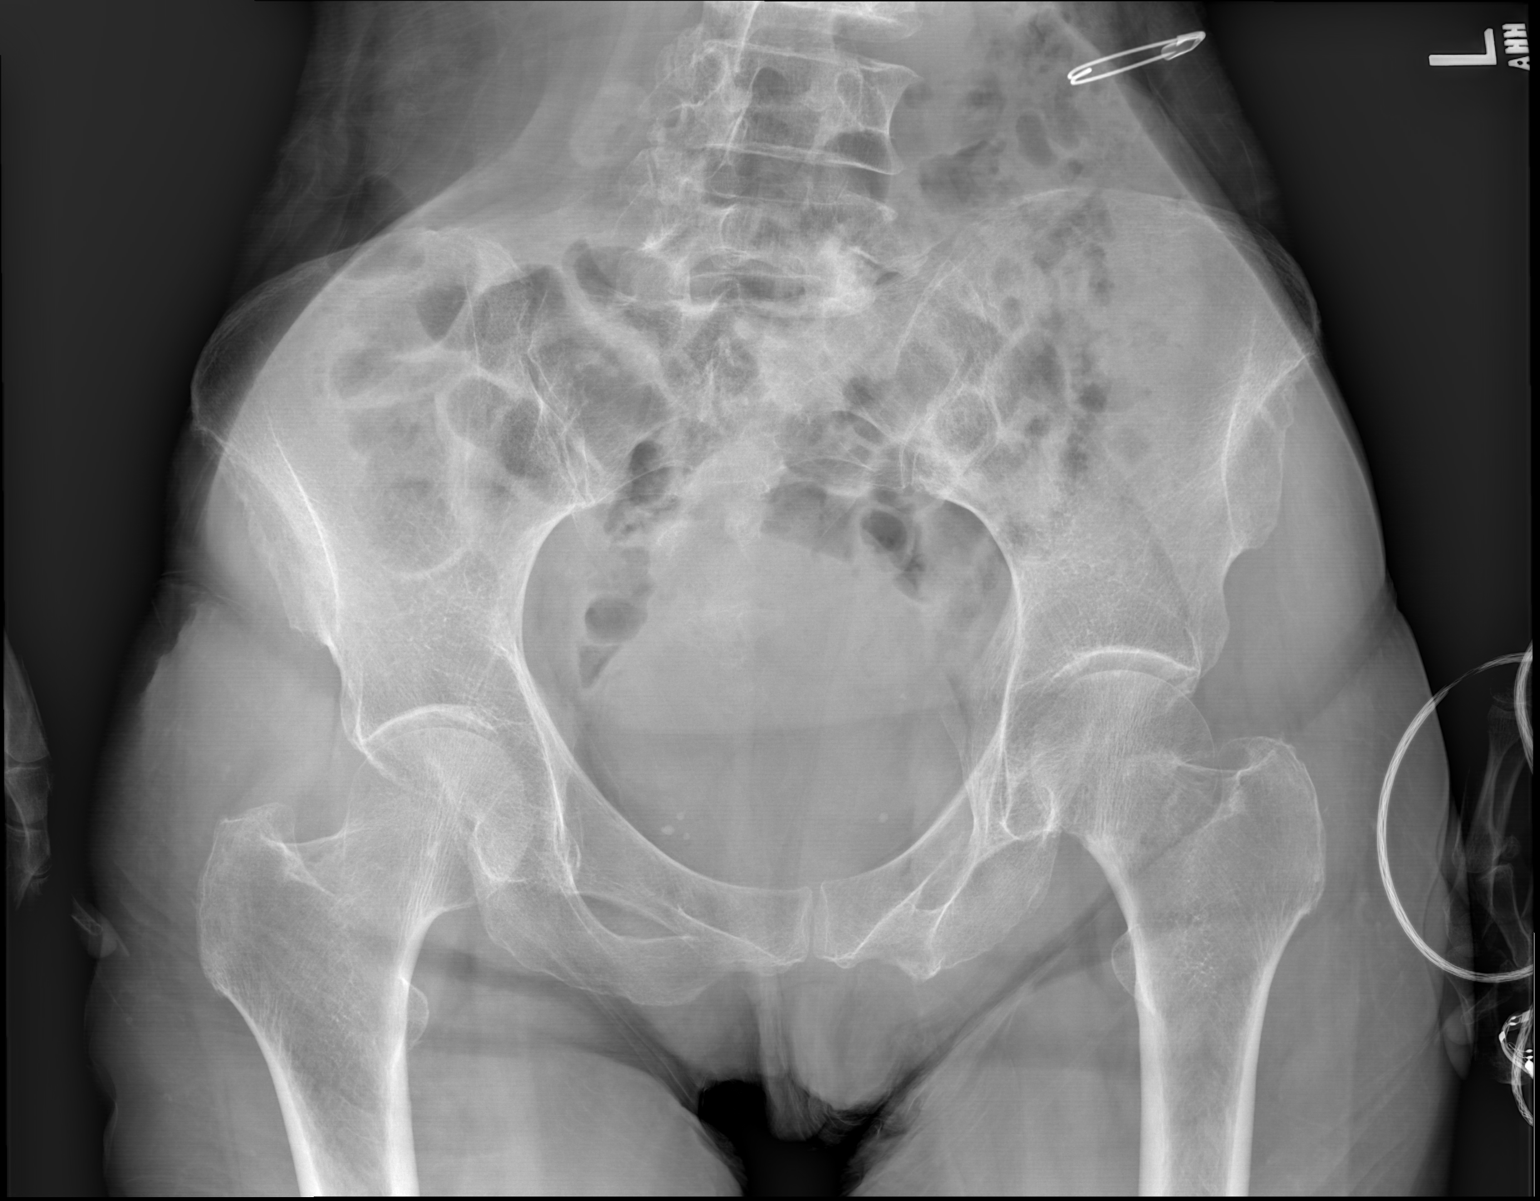

[x hip ap left]
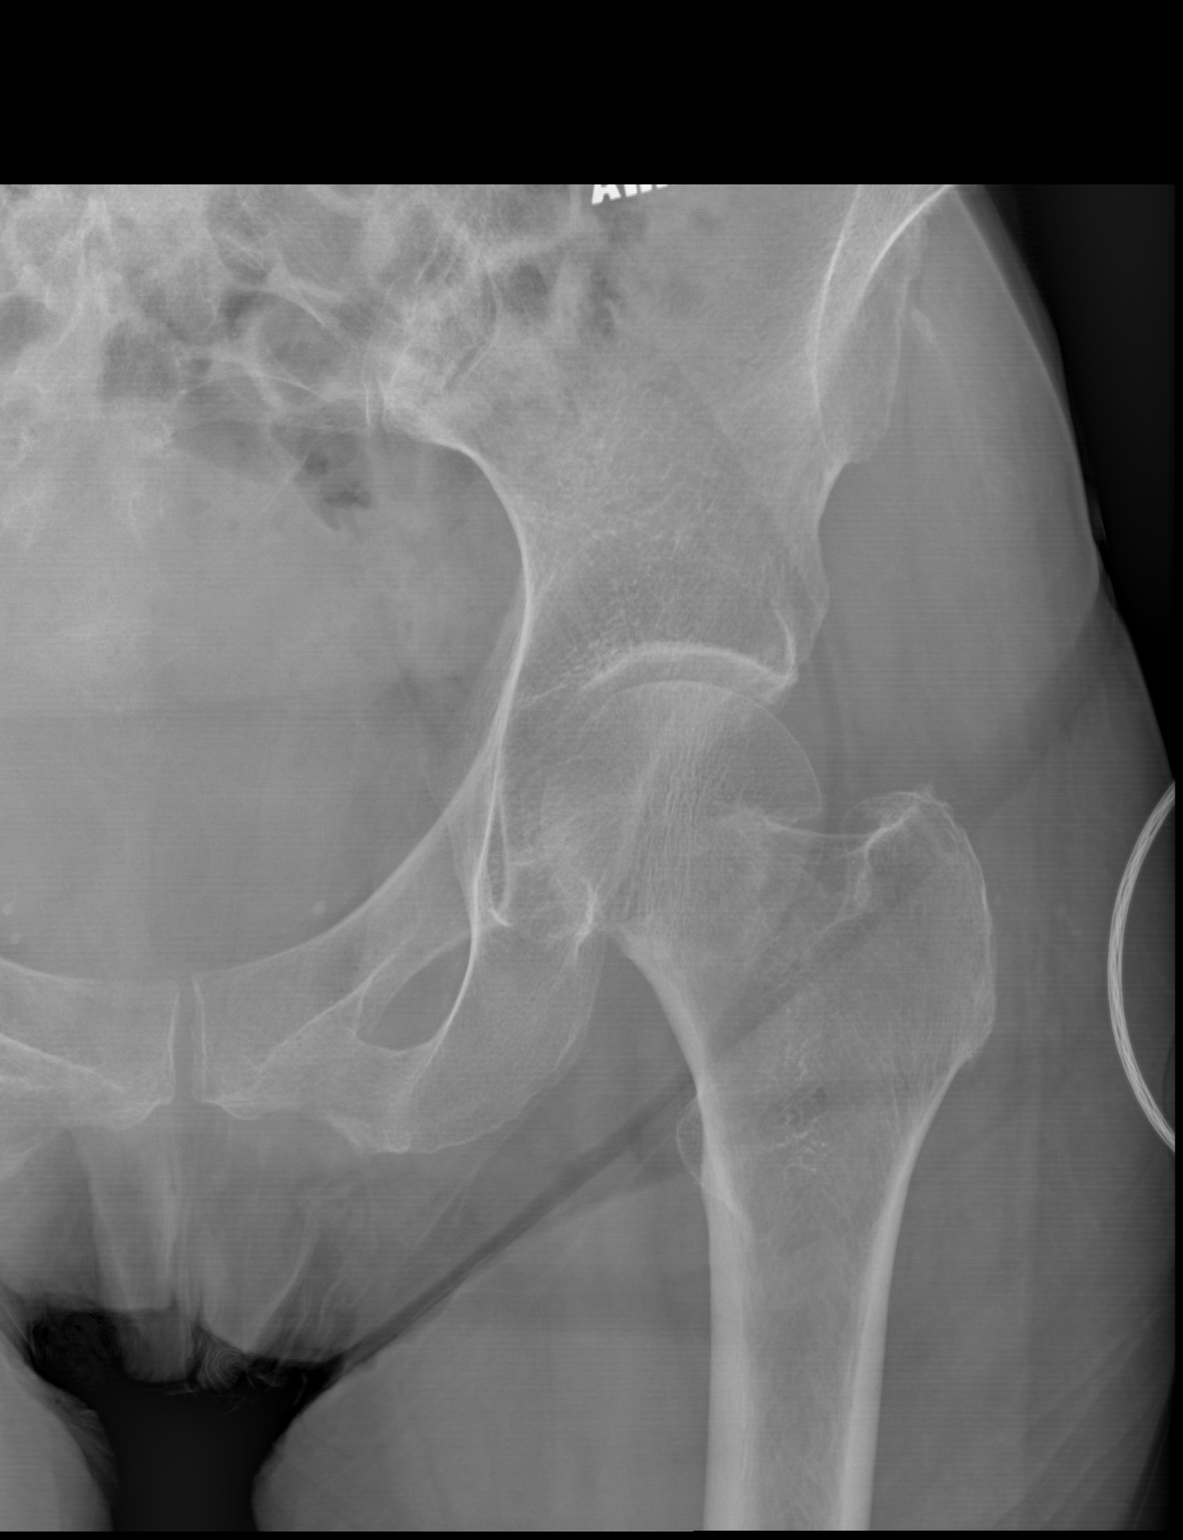

[w hip lat left]
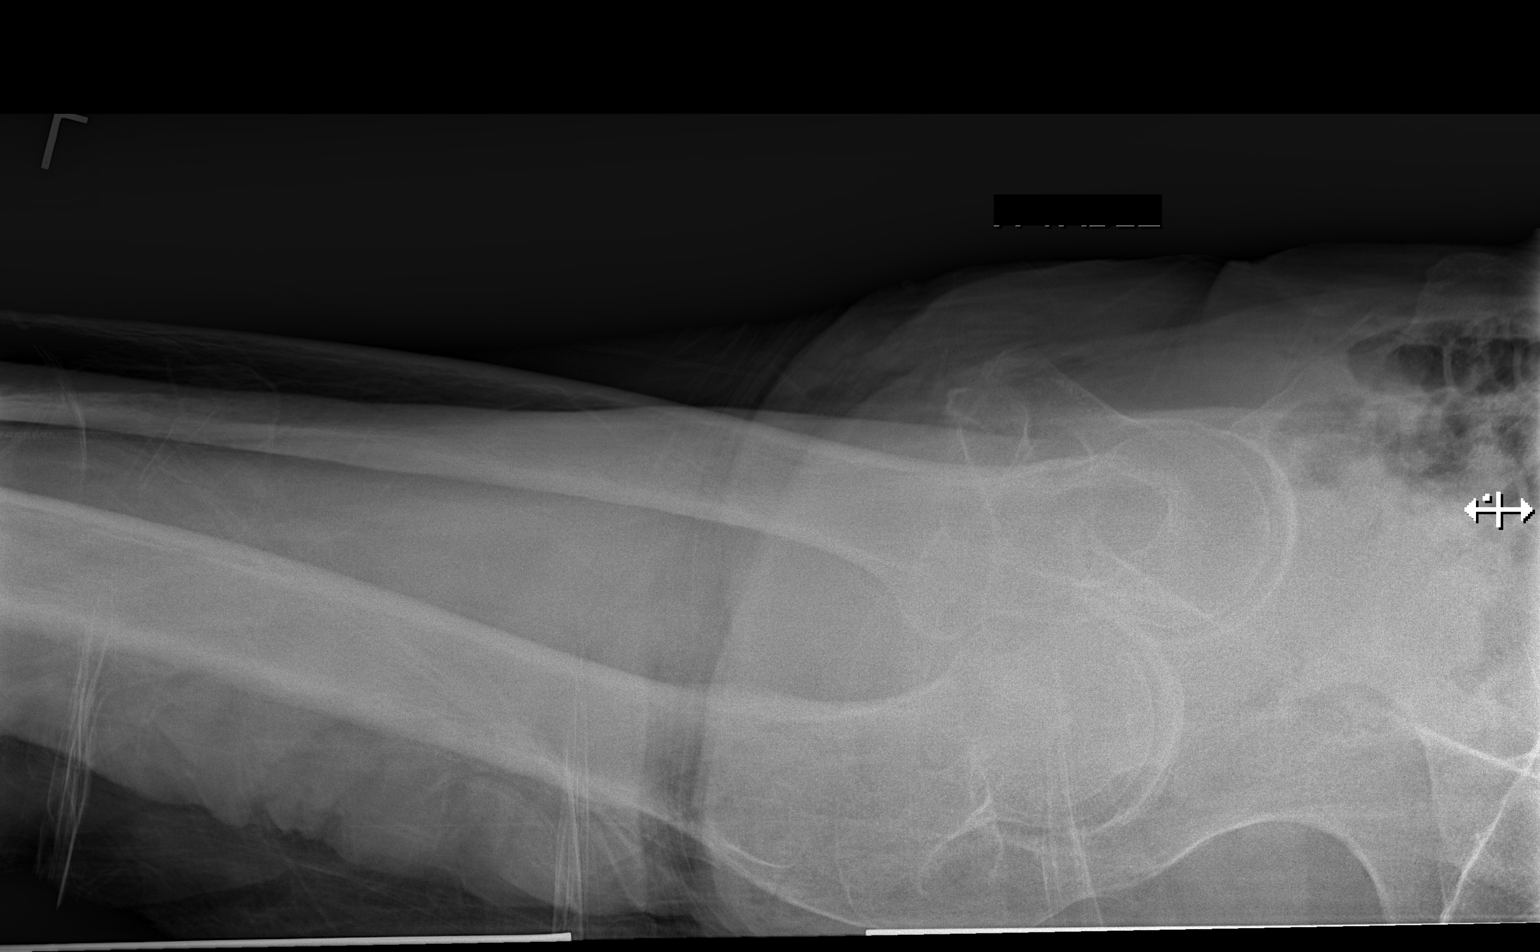

[3 of 3 positions shown; findings below may reference images not displayed]

FINDINGS: The bones are demineralized. There is an impacted subcapital
fracture of the left femoral neck. No other fractures are
demonstrated. The femoral heads are located. Mild degenerative
changes are present in the lower lumbar spine.
IMPRESSION: Impacted subcapital fracture of the left femoral neck.

## 2019-02-10 IMAGING — CR DG CHEST 2V
2 series · 2 of 2 positions shown · non-contrast
Comparison: 08/04/2011 and 12/31/2010.

CLINICAL DATA: Left hip fracture.

EXAM:
CHEST - 2 VIEW

[w chest lat]
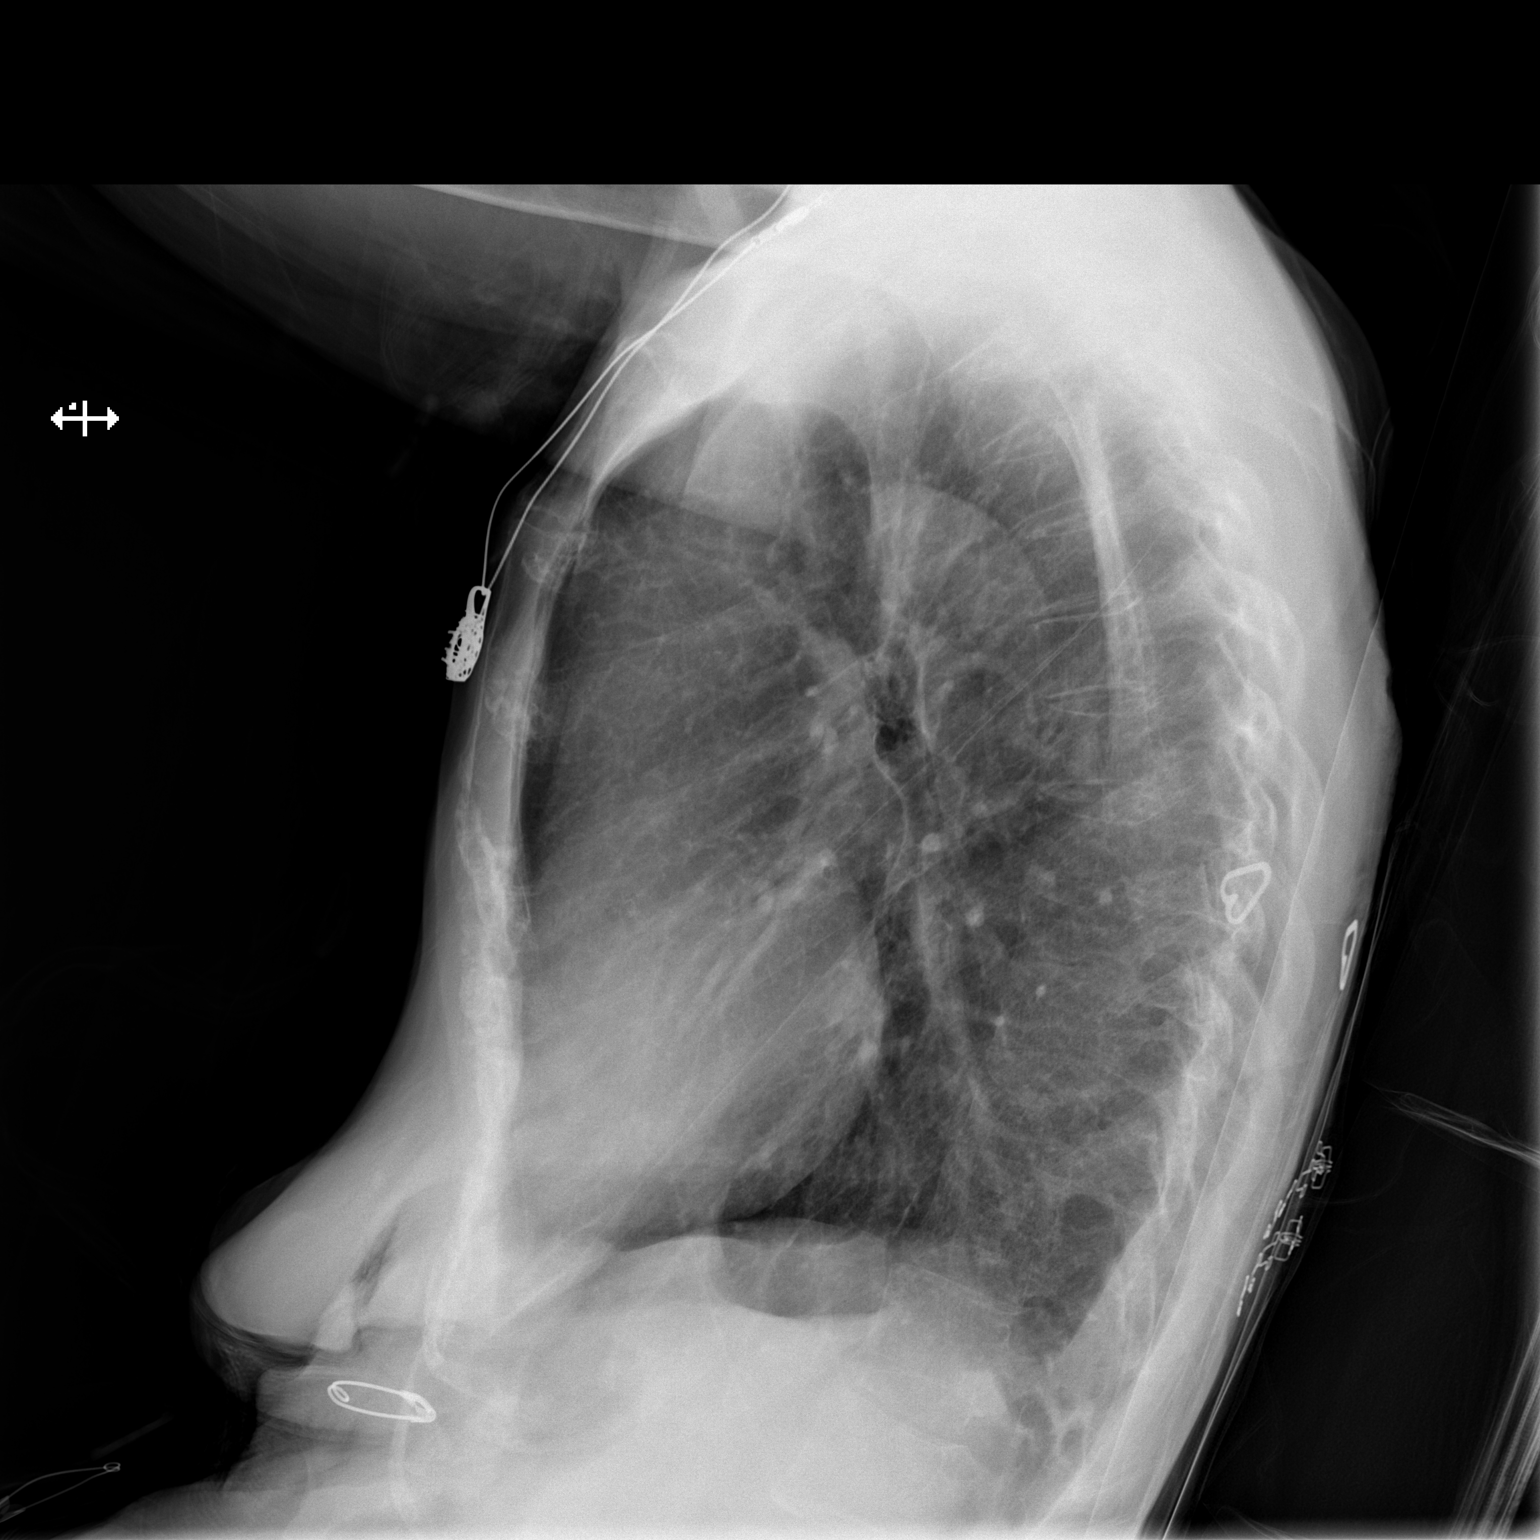

[x chest ap]
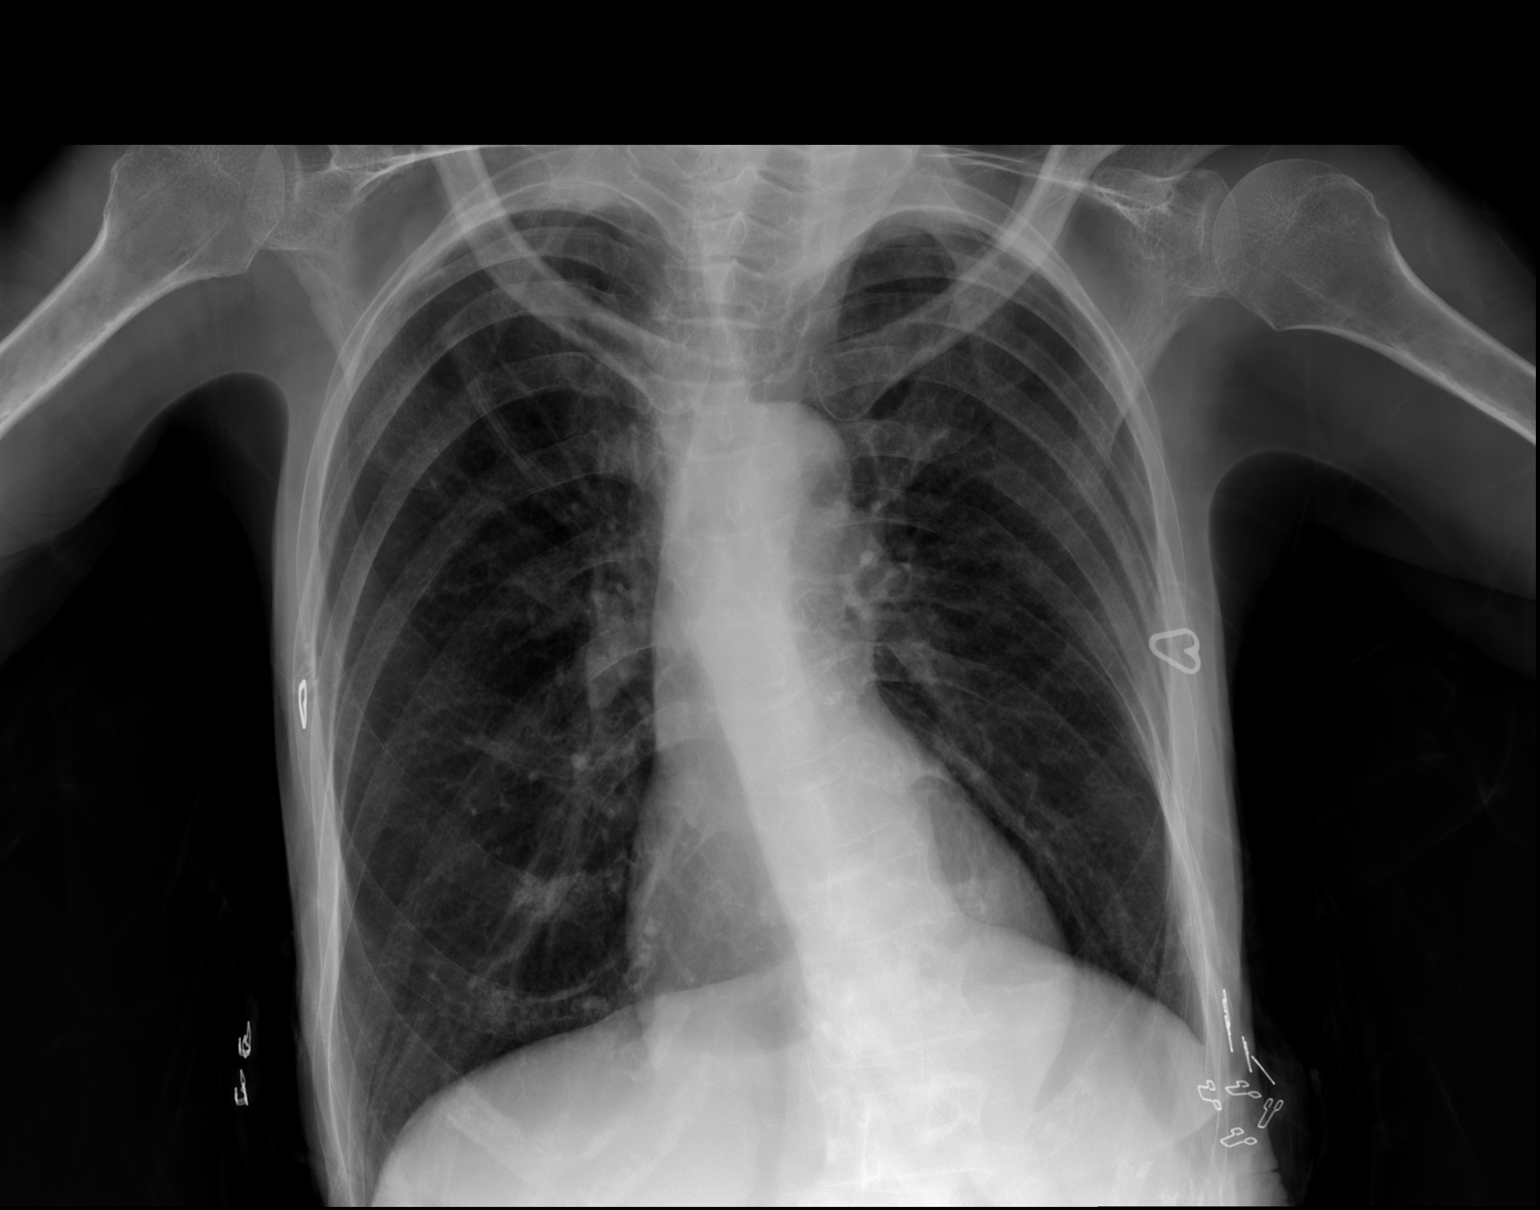

[2 of 2 positions shown; findings below may reference images not displayed]

FINDINGS: The heart size and mediastinal contours are stable. The lungs are
clear. There is no pleural effusion or pneumothorax. No acute
fractures are identified. There is a stable thoracolumbar scoliosis.
IMPRESSION: No active cardiopulmonary process.  Scoliosis.

## 2019-04-28 ENCOUNTER — Emergency Department (HOSPITAL_COMMUNITY): Payer: Medicare Other

## 2019-04-28 ENCOUNTER — Encounter (HOSPITAL_COMMUNITY): Payer: Self-pay | Admitting: Emergency Medicine

## 2019-04-28 ENCOUNTER — Other Ambulatory Visit: Payer: Self-pay

## 2019-04-28 ENCOUNTER — Emergency Department (HOSPITAL_COMMUNITY)
Admission: EM | Admit: 2019-04-28 | Discharge: 2019-04-28 | Disposition: A | Discharge status: Home / Self Care | Payer: Medicare Other | Attending: Emergency Medicine | Admitting: Emergency Medicine

## 2019-04-28 DIAGNOSIS — E119 Type 2 diabetes mellitus without complications: Secondary | ICD-10-CM | POA: Insufficient documentation

## 2019-04-28 DIAGNOSIS — N3001 Acute cystitis with hematuria: Secondary | ICD-10-CM | POA: Insufficient documentation

## 2019-04-28 DIAGNOSIS — Z7984 Long term (current) use of oral hypoglycemic drugs: Secondary | ICD-10-CM | POA: Insufficient documentation

## 2019-04-28 DIAGNOSIS — Z23 Encounter for immunization: Secondary | ICD-10-CM | POA: Diagnosis not present

## 2019-04-28 DIAGNOSIS — W19XXXA Unspecified fall, initial encounter: Secondary | ICD-10-CM | POA: Diagnosis not present

## 2019-04-28 DIAGNOSIS — R748 Abnormal levels of other serum enzymes: Secondary | ICD-10-CM

## 2019-04-28 DIAGNOSIS — Y9389 Activity, other specified: Secondary | ICD-10-CM | POA: Diagnosis not present

## 2019-04-28 DIAGNOSIS — R7989 Other specified abnormal findings of blood chemistry: Secondary | ICD-10-CM | POA: Insufficient documentation

## 2019-04-28 DIAGNOSIS — Y92122 Bedroom in nursing home as the place of occurrence of the external cause: Secondary | ICD-10-CM | POA: Diagnosis not present

## 2019-04-28 DIAGNOSIS — I10 Essential (primary) hypertension: Secondary | ICD-10-CM | POA: Insufficient documentation

## 2019-04-28 DIAGNOSIS — Y999 Unspecified external cause status: Secondary | ICD-10-CM | POA: Diagnosis not present

## 2019-04-28 DIAGNOSIS — S0990XA Unspecified injury of head, initial encounter: Secondary | ICD-10-CM | POA: Diagnosis present

## 2019-04-28 DIAGNOSIS — Z79899 Other long term (current) drug therapy: Secondary | ICD-10-CM | POA: Insufficient documentation

## 2019-04-28 DIAGNOSIS — Z7901 Long term (current) use of anticoagulants: Secondary | ICD-10-CM | POA: Diagnosis not present

## 2019-04-28 DIAGNOSIS — F0390 Unspecified dementia without behavioral disturbance: Secondary | ICD-10-CM | POA: Diagnosis not present

## 2019-04-28 DIAGNOSIS — S0181XA Laceration without foreign body of other part of head, initial encounter: Secondary | ICD-10-CM

## 2019-04-28 HISTORY — DX: Essential (primary) hypertension: I10

## 2019-04-28 LAB — URINALYSIS, ROUTINE W REFLEX MICROSCOPIC
Bilirubin Urine: NEGATIVE
Glucose, UA: NEGATIVE mg/dL
Ketones, ur: 20 mg/dL — AB
Nitrite: POSITIVE — AB
Protein, ur: 100 mg/dL — AB
Specific Gravity, Urine: 1.018 (ref 1.005–1.030)
WBC, UA: >50 WBC/hpf — ABNORMAL HIGH (ref 0–5)
pH: 5 (ref 5.0–8.0)

## 2019-04-28 LAB — CK: Total CK: 1657 U/L — ABNORMAL HIGH (ref 38–234)

## 2019-04-28 LAB — BASIC METABOLIC PANEL WITH GFR
Anion gap: 14 (ref 5–15)
BUN: 16 mg/dL (ref 8–23)
CO2: 28 mmol/L (ref 22–32)
Calcium: 8.8 mg/dL — ABNORMAL LOW (ref 8.9–10.3)
Chloride: 93 mmol/L — ABNORMAL LOW (ref 98–111)
Creatinine, Ser: 0.71 mg/dL (ref 0.44–1.00)
GFR calc Af Amer: >60 mL/min (ref >60)
GFR calc non Af Amer: >60 mL/min (ref >60)
Glucose, Bld: 103 mg/dL — ABNORMAL HIGH (ref 70–99)
Potassium: 3.4 mmol/L — ABNORMAL LOW (ref 3.5–5.1)
Sodium: 135 mmol/L (ref 135–145)

## 2019-04-28 LAB — CBC WITH DIFFERENTIAL/PLATELET
Abs Immature Granulocytes: 0.04 10*3/uL (ref 0.00–0.07)
Basophils Absolute: 0 10*3/uL (ref 0.0–0.1)
Basophils Relative: 0 %
Eosinophils Absolute: 0 10*3/uL (ref 0.0–0.5)
Eosinophils Relative: 0 %
HCT: 34.4 % — ABNORMAL LOW (ref 36.0–46.0)
Hemoglobin: 11.6 g/dL — ABNORMAL LOW (ref 12.0–15.0)
Immature Granulocytes: 0 %
Lymphocytes Relative: 5 %
Lymphs Abs: 0.5 10*3/uL — ABNORMAL LOW (ref 0.7–4.0)
MCH: 32.4 pg (ref 26.0–34.0)
MCHC: 33.7 g/dL (ref 30.0–36.0)
MCV: 96.1 fL (ref 80.0–100.0)
Monocytes Absolute: 0.8 10*3/uL (ref 0.1–1.0)
Monocytes Relative: 7 %
Neutro Abs: 9.7 10*3/uL — ABNORMAL HIGH (ref 1.7–7.7)
Neutrophils Relative %: 88 %
Platelets: 333 10*3/uL (ref 150–400)
RBC: 3.58 MIL/uL — ABNORMAL LOW (ref 3.87–5.11)
RDW: 12.5 % (ref 11.5–15.5)
WBC: 11 10*3/uL — ABNORMAL HIGH (ref 4.0–10.5)
nRBC: 0 % (ref 0.0–0.2)

## 2019-04-28 MED ORDER — TETANUS-DIPHTH-ACELL PERTUSSIS 5-2.5-18.5 LF-MCG/0.5 IM SUSP
0.5000 mL | Freq: Once | INTRAMUSCULAR | Status: AC
  Administered 2019-04-28: 0.5 mL via INTRAMUSCULAR
  Filled 2019-04-28: qty 0.5

## 2019-04-28 MED ORDER — POTASSIUM CHLORIDE CRYS ER 20 MEQ PO TBCR
20.0000 meq | EXTENDED_RELEASE_TABLET | Freq: Once | ORAL | Status: AC
  Administered 2019-04-28: 20 meq via ORAL
  Filled 2019-04-28: qty 1

## 2019-04-28 MED ORDER — CEPHALEXIN 500 MG PO CAPS: 500.0000 mg | ORAL_CAPSULE | Freq: Three times a day (TID) | ORAL | 0 refills | Status: AC

## 2019-04-28 MED ORDER — SODIUM CHLORIDE 0.9 % IV BOLUS
500.0000 mL | Freq: Once | INTRAVENOUS | Status: AC
  Administered 2019-04-28: 500 mL via INTRAVENOUS

## 2019-04-28 MED ORDER — SODIUM CHLORIDE 0.9 % IV SOLN
1.0000 g | Freq: Once | INTRAVENOUS | Status: AC
  Administered 2019-04-28: 1 g via INTRAVENOUS
  Filled 2019-04-28: qty 10

## 2019-04-28 NOTE — ED Notes (Addendum)
Pt ambulated in room. Pt tolerated ambulation, reported pain in stomach and back before beginning. Pt was able to stand, turn and walk about 10 ft. Patient reported feeling better.

## 2019-04-28 NOTE — Discharge Instructions (Signed)
Please take all of your antibiotics until finished!   Take your antibiotics with food.  Common side effects of antibiotics include nausea, vomiting, abdominal discomfort, and diarrhea. You may help offset some of this with probiotics which you can buy or get in yogurt. Do not eat  or take the probiotics until 2 hours after your antibiotic.    Your head imaging did not show any signs of skull fracture or bleed.  Steri-Strips will fall off within about 5 days.  Keep the wound clean and dry.  Urine test showed evidence of UTI.  This is why the antibiotics have been prescribed.  Work-up today showed elevated CK; please make sure patient remains very well-hydrated and please recheck CK level and kidney function in the next 3 to 5 days.  Return to the emergency department if any concerning signs or symptoms develop such as fevers, weakness, altered mental status, persistent vomiting, or severe pain.

## 2019-04-28 NOTE — ED Notes (Signed)
Pt able to sit upright and drink water without diificulty, able to hold cup and place cup on side table.

## 2019-04-28 NOTE — ED Notes (Signed)
Pt tolerated xrays with some noted c/o pain, unable to localize.  Extremities stiff with movement.  Oriented to person and place, but not time.  NAD noted.

## 2019-04-28 NOTE — ED Triage Notes (Signed)
Pt in from [facility] via [facility] after slip fall OOB this am, hitting R frontal/temporal head. Pt on Eliquis, has lac and dried blood present to area. Denies LOC, neck or back pain. c-collar present on arival. Pt a&ox4, pt also states recent urinary symptoms present. Temp 98.5

## 2019-04-28 NOTE — ED Provider Notes (Signed)
[facility] EMERGENCY DEPARTMENT Provider Note   CSN: [id number] Arrival date & time: 04/28/19  5027     History Chief Complaint  Patient presents with  . Fall  . Head Injury    LEVEL 5 CAVEAT DUE TO DEMENTIA  Colleen Aguilar is a 74 y.o. female with history of dementia, anxiety, type 2 diabetes mellitus, cataracts, GERD, hyperlipidemia, hypertension, osteoporosis, DVT, intellectual disability, depression presents from SNF for evaluation after fall.  She was reportedly found down in her room after an unwitnessed fall, possibly down for a couple of hours. She tells me that she slipped on the linoleum floor in an attempt to get out of bed and hit her right head but she does not remember how.  She denies loss of consciousness.  She does not think there was any prodrome leading up to the fall.  She is on Eliquis but cannot tell me why.  She denies any headache, vision changes, numbness or weakness of the extremities, neck pain.  She is a little bit confused, oriented to person and place but thinks that the year is 2017 and Monia Sabal is the president.  She cannot tell me the month but knows that Christmas recently passed.  She denies any pain but when asked to sit up she states "but it is going to hurt" and will not tell me where.  She is complaining of some dysuria and urinary urgency and states this has been ongoing for "a few months".  The history is provided by the patient. The history is limited by the condition of the patient.       Past Medical History:  Diagnosis Date  . Hypertension     There are no problems to display for this patient.   History reviewed. No pertinent surgical history.   OB History   No obstetric history on file.     No family history on file.  Social History   Tobacco Use  . Smoking status: Never Smoker  . Smokeless tobacco: Never Used  Substance Use Topics  . Alcohol use: Not Currently  . Drug use: Never    Home  Medications Prior to Admission medications   Medication Sig Start Date End Date Taking? Authorizing Provider  acetaminophen (TYLENOL) 500 MG tablet Take 500 mg by mouth 3 (three) times daily as needed for mild pain.   Yes [provider]  albuterol (VENTOLIN HFA) 108 (90 Base) MCG/ACT inhaler Inhale 2 puffs into the lungs every 6 (six) hours as needed for wheezing or shortness of breath.   Yes [provider]  alendronate (FOSAMAX) 35 MG tablet Take 35 mg by mouth every Saturday. Take with a full glass of water on an empty stomach.   Yes [provider]  apixaban (ELIQUIS) 5 MG TABS tablet Take 5 mg by mouth 2 (two) times daily.   Yes [provider]  atorvastatin (LIPITOR) 10 MG tablet Take 10 mg by mouth at bedtime.   Yes [provider]  budesonide-formoterol (SYMBICORT) 160-4.5 MCG/ACT inhaler Inhale 2 puffs into the lungs 2 (two) times daily.   Yes [provider]  calcium-vitamin D (OSCAL WITH D) 500-200 MG-UNIT tablet Take 1 tablet by mouth 2 (two) times daily.   Yes [provider]  docusate sodium (COLACE) 100 MG capsule Take 100 mg by mouth daily.   Yes [provider]  donepezil (ARICEPT) 10 MG tablet Take 10 mg by mouth at bedtime.   Yes [provider]  HYDROcodone-acetaminophen (NORCO/VICODIN) 5-325 MG tablet Take 1 tablet by mouth 2 (two) times daily.   Yes [provider]  loperamide (IMODIUM A-D) 2 MG tablet Take 2 mg by mouth every 4 (four) hours as needed for diarrhea or loose stools.   Yes [provider]  LORazepam (ATIVAN) 0.5 MG tablet Take 0.25 mg by mouth 2 (two) times daily.   Yes [provider]  metFORMIN (GLUCOPHAGE) 500 MG tablet Take 250 mg by mouth 2 (two) times daily with a meal.   Yes [provider]  ondansetron (ZOFRAN) 4 MG tablet Take 4 mg by mouth every 6 (six) hours as needed for nausea or vomiting.   Yes [provider]  sertraline  (ZOLOFT) 100 MG tablet Take 100 mg by mouth daily.   Yes [provider]  cephALEXin (KEFLEX) 500 MG capsule Take 1 capsule (500 mg total) by mouth 3 (three) times daily for 7 days. 04/28/19 05/05/19  Michela Pitcher A, PA-C    Allergies    Patient has no known allergies.  Review of Systems   Review of Systems  Unable to perform ROS: Dementia    Physical Exam Updated Vital Signs BP 120/60   Pulse 62   Temp 98.5 F (36.9 C) (Oral)   Resp (!) 25   Wt 52.2 kg   SpO2 99%   Physical Exam Vitals and nursing note reviewed.  Constitutional:      General: She is not in acute distress.    Appearance: She is well-developed.  HENT:     Head: Normocephalic.     Comments: Hematoma with overlying superficial laceration to the right forehead.  Bleeding controlled.  No hemotympanum.  No battle signs, raccoon eyes or rhinorrhea.  Dentition appears stable.  No tenderness to palpation of the face or skull Eyes:     General:        Right eye: No discharge.        Left eye: No discharge.     Conjunctiva/sclera: Conjunctivae normal.     Pupils: Pupils are equal, round, and reactive to light.  Neck:     Vascular: No JVD.     Trachea: No tracheal deviation.     Comments: c-collar in place Cardiovascular:     Rate and Rhythm: Normal rate and regular rhythm.  Pulmonary:     Effort: Pulmonary effort is normal.     Comments: Globally diminished breath sounds Abdominal:     General: Bowel sounds are normal. There is no distension.     Palpations: Abdomen is soft.     Tenderness: There is no abdominal tenderness. There is no guarding.  Musculoskeletal:     Cervical back: Neck supple.     Comments: Patient hesitant to actively or passively range extremities, resists massive movement.  Skin:    General: Skin is warm and dry.     Findings: No erythema.  Neurological:     Mental Status: She is alert. She is confused.     GCS: GCS eye subscore is 4. GCS verbal subscore is 4. GCS motor  subscore is 6.     Comments: Cranial nerves appear grossly intact.  No dysarthria or aphasia.  Follows most commands without difficulty.  Moves all extremities spontaneously but has limited range of motion which is baseline.  Good grip strength bilaterally and she is able to plantar flex and dorsiflex at the feet against resistance bilaterally without difficulty.  Psychiatric:        Behavior: Behavior  normal.     ED Results / Procedures / Treatments   Labs (all labs ordered are listed, but only abnormal results are displayed) Labs Reviewed  URINALYSIS, ROUTINE W REFLEX MICROSCOPIC - Abnormal; Notable for the following components:      Result Value   APPearance CLOUDY (*)    Hgb urine dipstick LARGE (*)    Ketones, ur 20 (*)    Protein, ur 100 (*)    Nitrite POSITIVE (*)    Leukocytes,Ua MODERATE (*)    WBC, UA >50 (*)    Bacteria, UA MANY (*)    All other components within normal limits  BASIC METABOLIC PANEL - Abnormal; Notable for the following components:   Potassium 3.4 (*)    Chloride 93 (*)    Glucose, Bld 103 (*)    Calcium 8.8 (*)    All other components within normal limits  CBC WITH DIFFERENTIAL/PLATELET - Abnormal; Notable for the following components:   WBC 11.0 (*)    RBC 3.58 (*)    Hemoglobin 11.6 (*)    HCT 34.4 (*)    Neutro Abs 9.7 (*)    Lymphs Abs 0.5 (*)    All other components within normal limits  CK - Abnormal; Notable for the following components:   Total CK 1,657 (*)    All other components within normal limits  URINE CULTURE    EKG EKG Interpretation  Date/Time:  Monday April 28 2019 08:32:59 EST Ventricular Rate:  66 PR Interval:    QRS Duration: 127 QT Interval:  416 QTC Calculation: 436 R Axis:   -34 Text Interpretation: Sinus rhythm Atrial premature complex Probable left atrial enlargement Right bundle branch block No old tracing to compare Confirmed by Melene PlanFloyd, Dan [phone]([id number]) on 04/28/2019 9:35:46 AM   Radiology CT Head Wo  Contrast  Result Date: 04/28/2019 CLINICAL DATA:  Fall at nursing home with head injury EXAM: CT HEAD WITHOUT CONTRAST CT CERVICAL SPINE WITHOUT CONTRAST TECHNIQUE: Multidetector CT imaging of the head and cervical spine was performed following the standard protocol without intravenous contrast. Multiplanar CT image reconstructions of the cervical spine were also generated. COMPARISON:  None. FINDINGS: CT HEAD FINDINGS Brain: No evidence of acute infarction, hemorrhage, hydrocephalus, extra-axial collection or mass lesion/mass effect. Vascular: Atherosclerotic calcification Skull: Right frontal scalp hematoma.  No calvarial fracture Sinuses/Orbits: Opacified left posterior ethmoid air cells without definite air cell expansion. CT CERVICAL SPINE FINDINGS Alignment: Degenerative reversal of cervical lordosis. Skull base and vertebrae: Negative for fracture Soft tissues and spinal canal: No prevertebral fluid or swelling. No visible canal hematoma. Disc levels: Lower cervical degenerative disc narrowing and ridging. Upper chest: Negative IMPRESSION: 1. No evidence of acute intracranial or cervical spine injury. 2. Scalp hematoma without calvarial fracture. 3. Left ethmoid sinusitis. Electronically Signed   By: Marnee SpringJonathon  Watts M.D.   On: 04/28/2019 07:37   CT Cervical Spine Wo Contrast  Result Date: 04/28/2019 CLINICAL DATA:  Fall at nursing home with head injury EXAM: CT HEAD WITHOUT CONTRAST CT CERVICAL SPINE WITHOUT CONTRAST TECHNIQUE: Multidetector CT imaging of the head and cervical spine was performed following the standard protocol without intravenous contrast. Multiplanar CT image reconstructions of the cervical spine were also generated. COMPARISON:  None. FINDINGS: CT HEAD FINDINGS Brain: No evidence of acute infarction, hemorrhage, hydrocephalus, extra-axial collection or mass lesion/mass effect. Vascular: Atherosclerotic calcification Skull: Right frontal scalp hematoma.  No calvarial fracture  Sinuses/Orbits: Opacified left posterior ethmoid air cells without definite air cell expansion. CT CERVICAL SPINE  FINDINGS Alignment: Degenerative reversal of cervical lordosis. Skull base and vertebrae: Negative for fracture Soft tissues and spinal canal: No prevertebral fluid or swelling. No visible canal hematoma. Disc levels: Lower cervical degenerative disc narrowing and ridging. Upper chest: Negative IMPRESSION: 1. No evidence of acute intracranial or cervical spine injury. 2. Scalp hematoma without calvarial fracture. 3. Left ethmoid sinusitis. Electronically Signed   By: Marnee Spring M.D.   On: 04/28/2019 07:37   DG Pelvis Portable  Result Date: 04/28/2019 CLINICAL DATA:  Trauma secondary to a fall. EXAM: PORTABLE PELVIS 1-2 VIEWS COMPARISON:  None. FINDINGS: There is no evidence of pelvic fracture or diastasis. No pelvic bone lesions are seen. Left total hip prosthesis appears in good position. Osteopenia. IMPRESSION: No acute abnormality. The patient is status post left total hip replacement. Electronically Signed   By: Francene Boyers M.D.   On: 04/28/2019 08:03   DG Chest Portable 1 View  Result Date: 04/28/2019 CLINICAL DATA:  Trauma secondary to a fall. EXAM: PORTABLE CHEST 1 VIEW COMPARISON:  None. FINDINGS: The heart size and mediastinal contours are within normal limits. Both lungs are clear. The visualized skeletal structures are unremarkable. IMPRESSION: No active disease. Electronically Signed   By: Francene Boyers M.D.   On: 04/28/2019 08:02    Procedures .Marland KitchenLaceration Repair  Date/Time: 04/28/2019 10:21 AM Performed by: Jeanie Sewer, PA-C Authorized by: Jeanie Sewer, PA-C   Consent:    Consent obtained:  Verbal   Consent given by:  Patient   Risks discussed:  Infection, pain and poor cosmetic result   Alternatives discussed:  No treatment and delayed treatment Anesthesia (see MAR for exact dosages):    Anesthesia method:  None Laceration details:    Location:  Face    Face location:  Forehead   Length (cm):  1   Depth (mm):  1 Repair type:    Repair type:  Simple Pre-procedure details:    Preparation:  Imaging obtained to evaluate for foreign bodies and patient was prepped and draped in usual sterile fashion Exploration:    Hemostasis achieved with:  Direct pressure   Wound exploration: wound explored through full range of motion     Wound extent: areolar tissue violated     Wound extent: no underlying fracture noted   Treatment:    Area cleansed with:  Betadine and saline   Amount of cleaning:  Standard   Irrigation solution:  Sterile saline Skin repair:    Repair method:  Steri-Strips   Number of Steri-Strips:  3 Approximation:    Approximation:  Close Post-procedure details:    Dressing:  Open (no dressing)   Patient tolerance of procedure:  Tolerated well, no immediate complications   (including critical care time)  Medications Ordered in ED Medications  Tdap (BOOSTRIX) injection 0.5 mL (0.5 mLs Intramuscular Given 04/28/19 0636)  sodium chloride 0.9 % bolus 500 mL (0 mLs Intravenous Stopped 04/28/19 0905)  sodium chloride 0.9 % bolus 500 mL (0 mLs Intravenous Stopped 04/28/19 1050)  cefTRIAXone (ROCEPHIN) 1 g in sodium chloride 0.9 % 100 mL IVPB (0 g Intravenous Stopped 04/28/19 1105)  potassium chloride SA (KLOR-CON) CR tablet 20 mEq (20 mEq Oral Given 04/28/19 1141)    ED Course  I have reviewed the triage vital signs and the nursing notes.  Pertinent labs & imaging results that were available during my care of the patient were reviewed by me and considered in my medical decision making (see chart for details).  MDM Rules/Calculators/A&P                      Patient presenting from SNF for evaluation after unwitnessed fall.  She is afebrile, mildly tachypneic in the ED but vital signs otherwise stable.  She is nontoxic in appearance.  She is mildly confused but has a history of dementia, no focal neurologic deficits.  Imaging shows  no evidence of intracranial abnormality or cervical spine injury.  She has a scalp hematoma with overlying superficial laceration but bleeding is controlled.  Base of the wound was visualized in a bloodless field and the wound was cleaned prior to closure with Steri-Strips.  Her tetanus was updated today.  Remainder of imaging shows no evidence of acute intracranial abnormality, no evidence of rib fracture, pneumothorax or pneumonia.  Imaging of the pelvis is stable.  EKG shows normal sinus rhythm with RBBB.   Lab work reviewed by me shows mild leukocytosis, mild anemia, no renal insufficiency, no significant metabolic derangements.  CK is elevated at around 1600 today, was given IV fluids in the ED.  She is also tolerating p.o. fluids UA is concerning for UTI, will culture and give a dose of IV Rocephin in the ED.   She was ambulated in the ED without difficulty.  Given she is tolerating p.o. fluids and her renal function is within normal limits she is likely stable for discharge back to her facility with close monitoring, outpatient antibiotics and encouragement of IV fluids.  I attempted to contact the patient's facility as did her nurse today and all attempts were unsuccessful.  Patient hemodynamically stable for discharge at this time. Discussed with Dr. Tyrone Nine who agrees with assessment and plan at this time.    Final Clinical Impression(s) / ED Diagnoses Final diagnoses:  Acute cystitis with hematuria  Fall, initial encounter  Injury of head, initial encounter  Laceration of forehead, initial encounter  Elevated CK    Rx / DC Orders ED Discharge Orders         Ordered    cephALEXin (KEFLEX) 500 MG capsule  3 times daily     04/28/19 1231           Renita Papa, PA-C 04/28/19 Joaquin, [place], DO 04/28/19 1249

## 2019-04-30 LAB — URINE CULTURE: Culture: >=100000 — AB

## 2019-05-01 ENCOUNTER — Telehealth: Payer: Self-pay

## 2019-05-01 NOTE — Telephone Encounter (Signed)
Post ED Visit - Positive Culture Follow-up  Culture report reviewed by antimicrobial stewardship pharmacist: [facility] []  Nathan Batchelder, .D. []  [place], Pharm.D., BCPS AQ-ID []  , Pharm.D., BCPS []  Celedonio Miyamoto, Pharm.D., BCPS []  [place], Garvin Fila.D., BCPS, AAHIVP []  , Pharm.D., BCPS, AAHIVP []  Georgina Pillion, PharmD, BCPS []  , PharmD, BCPS []  [place], PharmD, BCPS []  [place], PharmD []  , PharmD, BCPS []  Estella Husk, PharmD [facility] []  Lysle Pearl, PharmD []  , PharmD []  Phillips Climes, PharmD []  , Rph []  Agapito Games) , PharmD []  Verlan Friends, PharmD []  , PharmD []  Mervyn Gay, PharmD []  , PharmD []  Vinnie Level, PharmD []  Jefferson Fuel, PharmD []  , PharmD []  Len Childs, PharmD   Positive urine culture Treated with Cephalexin, organism sensitive to the same and no further patient follow-up is required at this time.  05/01/2019, 9:52 AM

## 2021-07-28 ENCOUNTER — Ambulatory Visit: Payer: Medicare Other | Admitting: Orthopedic Surgery

## 2021-07-29 ENCOUNTER — Ambulatory Visit (INDEPENDENT_AMBULATORY_CARE_PROVIDER_SITE_OTHER): Payer: Medicare Other

## 2021-07-29 ENCOUNTER — Ambulatory Visit (INDEPENDENT_AMBULATORY_CARE_PROVIDER_SITE_OTHER): Payer: Medicare Other | Admitting: Orthopedic Surgery

## 2021-07-29 ENCOUNTER — Encounter: Payer: Self-pay | Admitting: Orthopedic Surgery

## 2021-07-29 ENCOUNTER — Ambulatory Visit: Payer: Medicare Other

## 2021-07-29 VITALS — BP 121/70 | HR 62 | Ht 67.0 in | Wt 128.6 lb

## 2021-07-29 DIAGNOSIS — M549 Dorsalgia, unspecified: Secondary | ICD-10-CM

## 2021-07-29 DIAGNOSIS — M25561 Pain in right knee: Secondary | ICD-10-CM

## 2021-07-29 DIAGNOSIS — M25562 Pain in left knee: Secondary | ICD-10-CM

## 2021-07-29 DIAGNOSIS — M48062 Spinal stenosis, lumbar region with neurogenic claudication: Secondary | ICD-10-CM

## 2021-07-29 DIAGNOSIS — G8929 Other chronic pain: Secondary | ICD-10-CM

## 2021-07-29 DIAGNOSIS — M79605 Pain in left leg: Secondary | ICD-10-CM

## 2021-07-29 DIAGNOSIS — M79604 Pain in right leg: Secondary | ICD-10-CM | POA: Diagnosis not present

## 2021-07-29 NOTE — Progress Notes (Addendum)
NEW PROBLEM//OFFICE VISIT ? ? ?Chief Complaint  ?Patient presents with  ? Leg Pain  ?  Bilateral including the knees.   ? ?76 year old female lives at [place] [facility] home presents with bilateral leg pain bilateral knee pain ? ?She had a left hip fracture treated with a left total hip arthroplasty in [place] in 2019 ? ?She says that she is in severe pain ? ? ?Review of Systems  ?Unable to perform ROS: Mental acuity  ?Musculoskeletal:   ?     She denies hip pain and denies thigh pain  ?Neurological:  Negative for tingling.  ? ? ?BP 121/70   Pulse 62   Ht 5\' 7"  (1.702 m)   Wt 128 lb 9.6 oz (58.3 kg)   BMI 20.14 kg/m?  ? ?Body mass index is 20.14 kg/m?. ? ?General appearance: Well-developed well-nourished no gross deformities ? ?Cardiovascular normal pulse and perfusion normal color without edema ? ?Neurologically no sensation loss or deficits or pathologic reflexes ? ?Psychological: Awake alert and oriented x3 mood and affect normal ? ?Skin no lacerations or ulcerations no nodularity no palpable masses, no erythema or nodularity ? ?Musculoskeletal:  ? ?Gait requires a walker ? ?I am not sure of the examination that we are getting from this patient ? ?She has some type of mental IQ deficiency ? ?She says that everything hurts she hurts in her legs but not her thighs she says that her knees hurt when I moved her knees the first time she screamed when I moved him again no reaction ? ?She had palpable tenderness to both the joints and there was no effusion ? ?She had full range of motion ? ?There is no instability ? ?I am going to x-ray both knees ? ?Unfortunately her chart is incomplete they did not bring a med list for her so I am not going to order any medications until I see the med list ? ?She has a history of deep vein thrombosis requiring Eliquis but I am not sure if this is still a medication that she is on ? ?This note was noted June 24, 2017 at the time of her left hip fracture ?Brief Narrative:   ?Patient is 76 year old female history of intellectual disability, asthma, type 2 diabetes on oral medications, probable dementia, osteoporosis, on chronic anticoagulation presenting after mechanical fall and found to have an impacted subcapital left femoral neck fracture.  Patient noted not to be a reliable historian on admission.  ? ? ?Past Medical History:  ?Diagnosis Date  ? Asthma   ? ? dx  clinical dx unable to do pfts response to inhalers  ? Diabetes mellitus   ? DM (diabetes mellitus), type 2 ([place]) 06/21/2017  ? 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 Hiatal hernia   ? egd 2007  ? History of colonoscopy   ? 2003  ? Mental retardation   ? special needs lives with mom  ? ? ?Past Surgical History:  ?Procedure Laterality Date  ? ABDOMINAL HYSTERECTOMY    ? ESOPHAGOGASTRODUODENOSCOPY  2007  ? TOTAL HIP ARTHROPLASTY Left 06/21/2017  ? Procedure: TOTAL HIP ARTHROPLASTY ANTERIOR APPROACH LEFT;  Surgeon: Rod Can, MD;  Location: [facility];  Service: Orthopedics;  Laterality: Left;  ? ? ?Family History  ?Problem Relation Age of Onset  ? Diabetes Mother   ? Hyperlipidemia Mother   ? Other Sister   ?     neurologic disease died from ? prion disease  ? ?Social History  ? ?Tobacco Use  ?  Smoking status: Never  ? Smokeless tobacco: Never  ?Substance Use Topics  ? Alcohol use: No  ? Drug use: No  ? ? ?No Known Allergies ? ?No outpatient medications have been marked as taking for the 07/29/21 encounter (Office Visit) with Carole Civil, MD.  ? ? ? ?[place] ? ?A.  ?Encounter Diagnoses  ?Name Primary?  ? Pain in both knees, unspecified chronicity Yes  ? Pain in both lower extremities   ? Chronic back pain greater than 3 months duration   ? Spinal stenosis of lumbar region with neurogenic claudication   ? ? ?B. DATA ANALYSED: ? ? ?IMAGING: ?Interpretation of images: I have personally reviewed the images and my interpretation is x-rays of both knees showed no evidence of arthritis or malalignment ?Orders: No new  orders ? ?Outside records reviewed: None ? ? ?C. MANAGEMENT  ? ?The patient has no arthritis in her knees ? ?After I told her that she then said that I told him all along it was my back.  She complains of back pain radiating down both legs into her feet associated with some feelings of numbness tingling and heaviness ? ?I advised the chaperone who is with her to send [place] a copy of her med list and I can prescribe some gabapentin for her and I also advised that she see neurologist for further care ? ?The patient says she has had trouble for the last 8 years and no one would help her ? 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 ? ? ? ?Arther Abbott, MD ? ?07/30/2021 ?9:12 AM ?

## 2021-07-29 NOTE — Progress Notes (Signed)
Dg knee   

## 2021-07-29 NOTE — Patient Instructions (Signed)
Please forward a medication list so that I can prescribe something for the leg pain ?

## 2021-07-30 NOTE — Addendum Note (Signed)
Addended by: Vickki Hearing on: 07/30/2021 09:12 AM ? ? Modules accepted: Level of Service ? ?

## 2022-08-25 ENCOUNTER — Encounter: Payer: Self-pay | Admitting: Orthopedic Surgery

## 2022-08-25 ENCOUNTER — Ambulatory Visit (INDEPENDENT_AMBULATORY_CARE_PROVIDER_SITE_OTHER): Payer: Medicare Other | Admitting: Orthopedic Surgery

## 2022-08-25 DIAGNOSIS — M79604 Pain in right leg: Secondary | ICD-10-CM | POA: Diagnosis not present

## 2022-08-25 DIAGNOSIS — M48062 Spinal stenosis, lumbar region with neurogenic claudication: Secondary | ICD-10-CM

## 2022-08-25 DIAGNOSIS — M79605 Pain in left leg: Secondary | ICD-10-CM | POA: Diagnosis not present

## 2022-08-25 NOTE — Progress Notes (Signed)
Chief Complaint  Patient presents with   Leg Pain    Patient here for follow up leg pain/ has medication list with her this time, updated      77 year old female with leg pain  Patient has a history of multiple images done of her to lumbar spine for back pain and leg pain going back as far as 2004, CT in 2000 for an MRI again in 2007 also had his MRI with a myelogram in 7275  77 year old female complains of pain in her left leg.  She is difficult because she starts out complaining of medial knee pain but then on examination has pain from her lower back right middle and left down her entire leg into the bottom of her foot  She is on gabapentin and tramadol and Eliquis amongst other medications  Her plain films show no significant arthritis in her knees  Her right leg is completely pain-free to palpation   Assessment and plan Comes back again after being seen April 2023 with recommendations for her to be seen by orthospine or neurosurgery at that time  Again the same recommendation see orthospine or neurosurgery for radiculopathy   Encounter Diagnoses  Name Primary?   Pain in both lower extremities Yes   Spinal stenosis of lumbar region with neurogenic claudication

## 2022-11-14 ENCOUNTER — Other Ambulatory Visit: Payer: Self-pay | Admitting: Neurological Surgery

## 2022-11-14 DIAGNOSIS — M5416 Radiculopathy, lumbar region: Secondary | ICD-10-CM

## 2022-12-06 ENCOUNTER — Ambulatory Visit
Admission: RE | Admit: 2022-12-06 | Discharge: 2022-12-06 | Disposition: A | Discharge status: Home / Self Care | Payer: Medicare Other | Source: Ambulatory Visit | Attending: Neurological Surgery | Admitting: Neurological Surgery

## 2022-12-06 DIAGNOSIS — M5416 Radiculopathy, lumbar region: Secondary | ICD-10-CM

## 2023-01-17 ENCOUNTER — Other Ambulatory Visit: Payer: Self-pay

## 2023-01-17 ENCOUNTER — Ambulatory Visit (HOSPITAL_COMMUNITY): Payer: Medicare Other | Attending: Student | Admitting: Physical Therapy

## 2023-01-17 DIAGNOSIS — R2689 Other abnormalities of gait and mobility: Secondary | ICD-10-CM | POA: Insufficient documentation

## 2023-01-17 DIAGNOSIS — R2681 Unsteadiness on feet: Secondary | ICD-10-CM | POA: Insufficient documentation

## 2023-01-17 DIAGNOSIS — R293 Abnormal posture: Secondary | ICD-10-CM | POA: Insufficient documentation

## 2023-01-17 DIAGNOSIS — M6281 Muscle weakness (generalized): Secondary | ICD-10-CM | POA: Diagnosis present

## 2023-01-17 NOTE — Therapy (Signed)
OUTPATIENT PHYSICAL THERAPY THORACOLUMBAR EVALUATION   Patient Name: Colleen Aguilar MRN: [id number] DOB:[birth year], 77 y.o., female Today's Date: 01/17/2023  END OF SESSION:  PT End of Session - 01/17/23 0809     Visit Number 1    Number of Visits 16    Date for PT Re-Evaluation 03/14/23    Authorization Type Medicare    Progress Note Due on Visit 10    PT Start Time 0809   late sign in   PT Stop Time 0850    PT Time Calculation (min) 41 min             Past Medical History:  Diagnosis Date   Asthma    ? dx  clinical dx unable to do pfts response to inhalers   Diabetes mellitus    DM (diabetes mellitus), type 2 (HCC) 06/21/2017   Dysphagia    psychogenic   Hiatal hernia    egd 2007   History of colonoscopy    2003   Mental retardation    special needs lives with mom   Past Surgical History:  Procedure Laterality Date   ABDOMINAL HYSTERECTOMY     ESOPHAGOGASTRODUODENOSCOPY  2007   TOTAL HIP ARTHROPLASTY Left 06/21/2017   Procedure: TOTAL HIP ARTHROPLASTY ANTERIOR APPROACH LEFT;  Surgeon: Samson Frederic, MD;  Location: [facility];  Service: Orthopedics;  Laterality: Left;   Patient Active Problem List   Diagnosis Date Noted   Vitamin B12 deficiency 06/22/2017   Postoperative anemia due to acute blood loss 06/22/2017   DM (diabetes mellitus), type 2 (HCC) 06/21/2017   Displaced fracture of left femoral neck (HCC) 06/21/2017   Left displaced femoral neck fracture (HCC)    Hip fracture requiring operative repair, left, closed, initial encounter (HCC) 06/20/2017   Acute venous embolism and thrombosis of deep vessels of proximal lower extremity (HCC) 06/20/2017   Need for lipid screening 08/12/2010   Underweight 08/12/2010   Chronic headaches 08/12/2010   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   FLATULENCE ERUCTATION AND GAS PAIN 02/03/2009   DIARRHEA 02/03/2009   ADVERSE REACTION TO MEDICATION 02/03/2009   Headache 10/13/2008   URINARY INCONTINENCE 10/13/2008   CERUMEN  IMPACTION 09/21/2008   HEMATURIA UNSPECIFIED 09/21/2008   OTHER ANXIETY STATES 03/16/2008   ELBOW INJURY 03/16/2008   UNSPECIFIED MENTAL RETARDATION 11/15/2007   CHEST WALL PAIN, ANTERIOR 11/15/2007   Asthma 12/04/2006    PCP: Christene Lye, FNP  REFERRING PROVIDER: Sherryl Manges, NP  REFERRING DIAG: M54.16 (ICD-10-CM) - Radiculopathy, lumbar region  Rationale for Evaluation and Treatment: Rehabilitation  THERAPY DIAG:  Muscle weakness (generalized)  Abnormal posture  Other abnormalities of gait and mobility  Unsteadiness on feet  ONSET DATE: 1.5 years  SUBJECTIVE:  SUBJECTIVE STATEMENT: Pt reports she has a pinched nerve in her back. Pt states pain is more on the right side of her back. Pt states it's painful to sleep. Pt reports it keeps her from walking. Can only walk a couple of hours. At night when she goes to the bathroom it hurts the most. Feels she can only sit for about 1.5 hours. Pt lives in ALF. Pt states she is scared to stand by herself (especially in shower) due to fear of falls.   PERTINENT HISTORY:  R pelvic fracture 10 years ago (feels it has never healed all the way)  PAIN:  Are you having pain? Yes: NPRS scale: Currently 7, at worst 10/10 Pain location: R low back Pain description: Sharp Aggravating factors: Weather, increased time walking, standing or sitting Relieving factors: Sometimes resting it can help  PRECAUTIONS: None  RED FLAGS: None   WEIGHT BEARING RESTRICTIONS: No  FALLS:  Has patient fallen in last 6 months? No  LIVING ENVIRONMENT: Lives in [facility] Has following equipment at home: Dan Humphreys - 4 wheeled  OCCUPATION: Retired. Wants to participate in more walking (3 miles at her church)  PLOF: Independent  PATIENT GOALS: Decrease  back pain with activities  NEXT MD VISIT: n/a  OBJECTIVE:  Note: Objective measures were completed at Evaluation unless otherwise noted.  DIAGNOSTIC FINDINGS:  MRI lumbar spine 12/06/22 IMPRESSION: 1. Scoliotic curvature convex to the left with the apex at L2. 2. L3-4: Bulging of the disc more prominent in the left posterolateral to foraminal region. Mild facet and ligamentous hypertrophy left more than right. Stenosis of the left lateral recess and the intervertebral foramen on the left that could cause neural compression. 3. L4-5: Bulging of the disc. Mild facet and ligamentous hypertrophy. Mild narrowing of the left lateral recess and foramen but without likely neural compression at this level. 4. L5-S1: Mild bulging of the disc. Mild facet osteoarthritis. No stenosis.  PATIENT SURVEYS:  FOTO 41; predicted 50  SCREENING FOR RED FLAGS: Bowel or bladder incontinence: No Spinal tumors: No Cauda equina syndrome: No Compression fracture: No Abdominal aneurysm: No  COGNITION: Overall cognitive status: Within functional limits for tasks assessed     SENSATION: WFL  MUSCLE LENGTH: Hamstrings: Right 45 deg; Left 45 deg Thomas test: Right 5 deg; Left 0 deg  POSTURE: rounded shoulders and flexed trunk   PALPATION: Bilat lumbar paraspinal increased tone  LUMBAR ROM:   AROM eval  Flexion 80%  Extension 10% *  Right lateral flexion To knee but also has to come slightly towards forward flexion *  Left lateral flexion To knee but also has to come slightly towards forward flexion  Right rotation 25%*  Left rotation 25%*   (Blank rows = not tested, * = pain)  LOWER EXTREMITY ROM:     Active  Right eval Left eval  Hip flexion    Hip extension    Hip abduction    Hip adduction    Hip internal rotation    Hip external rotation    Knee flexion    Knee extension    Ankle dorsiflexion    Ankle plantarflexion    Ankle inversion    Ankle eversion     (Blank rows =  not tested)  LOWER EXTREMITY MMT:    MMT Right eval Left eval  Hip flexion 3+ 3+  Hip extension 3- 2+  Hip abduction 3- 2+  Hip adduction    Hip internal rotation    Hip  external rotation    Knee flexion 4 4  Knee extension 4 4-  Ankle dorsiflexion    Ankle plantarflexion    Ankle inversion    Ankle eversion     (Blank rows = not tested)  LUMBAR SPECIAL TESTS:  Straight leg raise test: Positive and FABER test: Positive  FUNCTIONAL TESTS:  5 times sit to stand: 14.14 sec (but unable to come up into full trunk and hip extension) 2 min walk 192' with rollator  GAIT: Distance walked: Around clinic Assistive device utilized: Environmental consultant - 4 wheeled Level of assistance: SBA Comments: Increased flexed posture, reduced hip extension  TODAY'S TREATMENT:                                                                                                                              DATE: 01/17/23  See HEP below   PATIENT EDUCATION:  Education details: Exam findings, POC, initial HEP Person educated: Patient Education method: Explanation, Demonstration, and Handouts Education comprehension: verbalized understanding, returned demonstration, and needs further education  HOME EXERCISE PROGRAM: Access Code: [email or web address] URL: [email or web address] Date: 01/17/2023 Prepared by: Vernon Prey April Kirstie Peri  Exercises - Supine Lower Trunk Rotation  - 1 x daily - 7 x weekly - 1 sets - 10 reps - Seated Hamstring Stretch  - 1 x daily - 7 x weekly - 1 sets - 30 sec hold - Seated Hip Flexor Stretch  - 1 x daily - 7 x weekly - 1 sets - 30 sec hold  ASSESSMENT:  CLINICAL IMPRESSION: Patient is a 77 y.o. F who was seen today for physical therapy evaluation and treatment for chronic back pain. PMH significant for R pelvic hip fracture and fear of falls. Assessment significant for decreased bilat hip and lumbar ROM, shortened hamstrings and hip flexors, abnormal posture, and gross LE  weakness affecting activities such as sleeping, standing, transfers and amb limiting participation in home and community tasks. Pt is a resident of ALF and has fear of falling while in the shower. Pt will benefit from PT to address these issues for improved ability to perform home and community tasks.   OBJECTIVE IMPAIRMENTS: Abnormal gait, decreased activity tolerance, decreased balance, decreased endurance, decreased mobility, difficulty walking, decreased ROM, decreased strength, hypomobility, increased fascial restrictions, impaired flexibility, improper body mechanics, postural dysfunction, and pain.   ACTIVITY LIMITATIONS: carrying, lifting, bending, sitting, standing, squatting, sleeping, stairs, transfers, bed mobility, bathing, toileting, hygiene/grooming, and locomotion level  PARTICIPATION LIMITATIONS: cleaning, shopping, and community activity  PERSONAL FACTORS: Age, Fitness, Past/current experiences, and Time since onset of injury/illness/exacerbation are also affecting patient's functional outcome.   REHAB POTENTIAL: Good  CLINICAL DECISION MAKING: Evolving/moderate complexity  EVALUATION COMPLEXITY: Moderate   GOALS: Goals reviewed with patient? Yes  SHORT TERM GOALS: Target date: 02/14/2023   Pt will be ind with initial HEP Baseline: Goal status: INITIAL  2.  Pt will have improved 5x STS to </=13 sec to demo increasing functional LE  strength Baseline:  Goal status: INITIAL  3.  Pt will demo hip extension ROM to at least 10 deg bilat for improved trunk extension and posture Baseline:  Goal status: INITIAL   LONG TERM GOALS: Target date: 03/14/2023   Pt will be ind with management and progression of HEP Baseline:  Goal status: INITIAL  2.  Pt will have improved 2 minute walk test to at least 400' to be closer to her age norms Baseline: 79' Goal status: INITIAL  3.  Pt will demo at least 4+/5 bilat LE strength for improved standing tolerance and  stability Baseline:  Goal status: INITIAL  4.  Pt will have improved FOTO score to >/=50 Baseline:  Goal status: INITIAL  5.  Pt will report improved comfort with sleep by >/=50% Baseline:  Goal status: INITIAL   PLAN:  PT FREQUENCY: 2x/week  PT DURATION: 8 weeks  PLANNED INTERVENTIONS: Therapeutic exercises, Therapeutic activity, Neuromuscular re-education, Balance training, Gait training, Patient/Family education, Self Care, Joint mobilization, Stair training, Aquatic Therapy, Dry Needling, Electrical stimulation, Spinal mobilization, Cryotherapy, Moist heat, Taping, Vasopneumatic device, Ionotophoresis 4mg /ml Dexamethasone, Manual therapy, and Re-evaluation.  PLAN FOR NEXT SESSION: Assess response to HEP. Work on hip and back stretching/mobility. Work on hip and core strengthening. Work on glute activation and initiate balance exercises.    Colleen Aguilar, PT 01/17/2023, 1:22 PM

## 2023-01-23 ENCOUNTER — Ambulatory Visit (HOSPITAL_COMMUNITY): Payer: Medicare Other

## 2023-01-23 DIAGNOSIS — R2681 Unsteadiness on feet: Secondary | ICD-10-CM

## 2023-01-23 DIAGNOSIS — M6281 Muscle weakness (generalized): Secondary | ICD-10-CM | POA: Diagnosis not present

## 2023-01-23 DIAGNOSIS — R2689 Other abnormalities of gait and mobility: Secondary | ICD-10-CM

## 2023-01-23 DIAGNOSIS — R293 Abnormal posture: Secondary | ICD-10-CM

## 2023-01-23 NOTE — Therapy (Signed)
OUTPATIENT PHYSICAL THERAPY THORACOLUMBAR EVALUATION   Patient Name: Colleen Aguilar MRN: [id number] DOB:1946/01/14, 77 y.o., female Today's Date: 01/23/2023  END OF SESSION:  PT End of Session - 01/23/23 1307     Visit Number 2    Number of Visits 16    Date for PT Re-Evaluation 03/14/23    Authorization Type Medicare    Progress Note Due on Visit 10    PT Start Time 0100    PT Stop Time 0140    PT Time Calculation (min) 40 min    Activity Tolerance Patient tolerated treatment well    Behavior During Therapy WFL for tasks assessed/performed             Past Medical History:  Diagnosis Date   Asthma    ? dx  clinical dx unable to do pfts response to inhalers   Diabetes mellitus    DM (diabetes mellitus), type 2 (HCC) 06/21/2017   Dysphagia    psychogenic   Hiatal hernia    egd 2007   History of colonoscopy    2003   Mental retardation    special needs lives with mom   Past Surgical History:  Procedure Laterality Date   ABDOMINAL HYSTERECTOMY     ESOPHAGOGASTRODUODENOSCOPY  2007   TOTAL HIP ARTHROPLASTY Left 06/21/2017   Procedure: TOTAL HIP ARTHROPLASTY ANTERIOR APPROACH LEFT;  Surgeon: Samson Frederic, MD;  Location: [facility];  Service: Orthopedics;  Laterality: Left;   Patient Active Problem List   Diagnosis Date Noted   Vitamin B12 deficiency 06/22/2017   Postoperative anemia due to acute blood loss 06/22/2017   DM (diabetes mellitus), type 2 (HCC) 06/21/2017   Displaced fracture of left femoral neck (HCC) 06/21/2017   Left displaced femoral neck fracture (HCC)    Hip fracture requiring operative repair, left, closed, initial encounter (HCC) 06/20/2017   Acute venous embolism and thrombosis of deep vessels of proximal lower extremity (HCC) 06/20/2017   Need for lipid screening 08/12/2010   Underweight 08/12/2010   Chronic headaches 08/12/2010   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   FLATULENCE ERUCTATION AND GAS PAIN 02/03/2009   DIARRHEA 02/03/2009   ADVERSE  REACTION TO MEDICATION 02/03/2009   Headache 10/13/2008   URINARY INCONTINENCE 10/13/2008   CERUMEN IMPACTION 09/21/2008   HEMATURIA UNSPECIFIED 09/21/2008   OTHER ANXIETY STATES 03/16/2008   ELBOW INJURY 03/16/2008   UNSPECIFIED MENTAL RETARDATION 11/15/2007   CHEST WALL PAIN, ANTERIOR 11/15/2007   Asthma 12/04/2006    PCP: Christene Lye, FNP  REFERRING PROVIDER: Sherryl Manges, NP  REFERRING DIAG: M54.16 (ICD-10-CM) - Radiculopathy, lumbar region  Rationale for Evaluation and Treatment: Rehabilitation  THERAPY DIAG:  No diagnosis found.  ONSET DATE: 1.5 years  SUBJECTIVE:  SUBJECTIVE STATEMENT:  Today: Patient states 8/10 pain in the right lower extremity. Patient states she fell on the right pelvis last night out of bed. PT cannot confirm if patient is good historian.   IE: Pt reports she has a pinched nerve in her back. Pt states pain is more on the right side of her back. Pt states it's painful to sleep. Pt reports it keeps her from walking. Can only walk a couple of hours. At night when she goes to the bathroom it hurts the most. Feels she can only sit for about 1.5 hours. Pt lives in ALF. Pt states she is scared to stand by herself (especially in shower) due to fear of falls.  Patient states she has a PMH of 19+ falls.  PERTINENT HISTORY:  R pelvic fracture 10 years ago (feels it has never healed all the way)  PAIN:  Are you having pain? Yes: NPRS scale: Currently 7, at worst 10/10 Pain location: R low back Pain description: Sharp Aggravating factors: Weather, increased time walking, standing or sitting Relieving factors: Sometimes resting it can help  PRECAUTIONS: None  RED FLAGS: None   WEIGHT BEARING RESTRICTIONS: No  FALLS:  Has patient fallen in last 6 months?  No  LIVING ENVIRONMENT: Lives in [facility] Has following equipment at home: Dan Humphreys - 4 wheeled  OCCUPATION: Retired. Wants to participate in more walking (3 miles at her church)  PLOF: Independent  PATIENT GOALS: Decrease back pain with activities  NEXT MD VISIT: n/a  OBJECTIVE:  Note: Objective measures were completed at Evaluation unless otherwise noted.  DIAGNOSTIC FINDINGS:  MRI lumbar spine 12/06/22 IMPRESSION: 1. Scoliotic curvature convex to the left with the apex at L2. 2. L3-4: Bulging of the disc more prominent in the left posterolateral to foraminal region. Mild facet and ligamentous hypertrophy left more than right. Stenosis of the left lateral recess and the intervertebral foramen on the left that could cause neural compression. 3. L4-5: Bulging of the disc. Mild facet and ligamentous hypertrophy. Mild narrowing of the left lateral recess and foramen but without likely neural compression at this level. 4. L5-S1: Mild bulging of the disc. Mild facet osteoarthritis. No stenosis.  PATIENT SURVEYS:  FOTO 41; predicted 50  SCREENING FOR RED FLAGS: Bowel or bladder incontinence: No Spinal tumors: No Cauda equina syndrome: No Compression fracture: No Abdominal aneurysm: No  COGNITION: Overall cognitive status: Within functional limits for tasks assessed     SENSATION: WFL  MUSCLE LENGTH: Hamstrings: Right 45 deg; Left 45 deg Thomas test: Right 5 deg; Left 0 deg  POSTURE: rounded shoulders and flexed trunk   PALPATION: Bilat lumbar paraspinal increased tone  LUMBAR ROM:   AROM eval  Flexion 80%  Extension 10% *  Right lateral flexion To knee but also has to come slightly towards forward flexion *  Left lateral flexion To knee but also has to come slightly towards forward flexion  Right rotation 25%*  Left rotation 25%*   (Blank rows = not tested, * = pain)  LOWER EXTREMITY ROM:     Active  Right eval Left eval  Hip flexion    Hip  extension    Hip abduction    Hip adduction    Hip internal rotation    Hip external rotation    Knee flexion    Knee extension    Ankle dorsiflexion    Ankle plantarflexion    Ankle inversion    Ankle eversion     (Blank  rows = not tested)  LOWER EXTREMITY MMT:    MMT Right eval Left eval  Hip flexion 3+ 3+  Hip extension 3- 2+  Hip abduction 3- 2+  Hip adduction    Hip internal rotation    Hip external rotation    Knee flexion 4 4  Knee extension 4 4-  Ankle dorsiflexion    Ankle plantarflexion    Ankle inversion    Ankle eversion     (Blank rows = not tested)  LUMBAR SPECIAL TESTS:  Straight leg raise test: Positive and FABER test: Positive  FUNCTIONAL TESTS:  5 times sit to stand: 14.14 sec (but unable to come up into full trunk and hip extension) 2 min walk 192' with rollator  GAIT: Distance walked: Around clinic Assistive device utilized: Environmental consultant - 4 wheeled Level of assistance: SBA Comments: Increased flexed posture, reduced hip extension  TODAY'S TREATMENT:                                                                                                                              DATE:  01/23/23 Supine Lower Trunk Rotation  Supine hip isometrics Supine Single Knee to Chest Stretch  Supine Double Knee to Chest   Left sidelying clamshells     01/17/23  See HEP below   PATIENT EDUCATION:  Education details: Exam findings, POC, initial HEP Person educated: Patient Education method: Explanation, Demonstration, and Handouts Education comprehension: verbalized understanding, returned demonstration, and needs further education  HOME EXERCISE PROGRAM: Access Code: [email or web address] URL: [email or web address] Date: 01/23/2023 Prepared by: Seymour Bars  Exercises - Supine Lower Trunk Rotation  - 1 sets - 10 reps - Supine Single Knee to Chest Stretch  - 1 x daily - 10 reps - Supine Double Knee to Chest  - 1 x daily - 10  reps  ASSESSMENT:  CLINICAL IMPRESSION: Patient with increased sciatic symptoms today; right lower extremity + SLR on right lower extremity at 5 degrees flexion. PT adjusted session to focus on flexion based movements on spine; adjusted HEP to include low-load spinal exercises. Pt's right SLR increased by 30 degrees with no pain. Patient will continue to benefit from PT to improve QoL and decrease Falls Rsik    IE: Patient is a 77 y.o. F who was seen today for physical therapy evaluation and treatment for chronic back pain. PMH significant for R pelvic hip fracture and fear of falls. Assessment significant for decreased bilat hip and lumbar ROM, shortened hamstrings and hip flexors, abnormal posture, and gross LE weakness affecting activities such as sleeping, standing, transfers and amb limiting participation in home and community tasks. Pt is a resident of ALF and has fear of falling while in the shower. Pt will benefit from PT to address these issues for improved ability to perform home and community tasks.   OBJECTIVE IMPAIRMENTS: Abnormal gait, decreased activity tolerance, decreased balance, decreased endurance, decreased mobility, difficulty walking, decreased ROM, decreased strength, hypomobility, increased fascial  restrictions, impaired flexibility, improper body mechanics, postural dysfunction, and pain.   ACTIVITY LIMITATIONS: carrying, lifting, bending, sitting, standing, squatting, sleeping, stairs, transfers, bed mobility, bathing, toileting, hygiene/grooming, and locomotion level  PARTICIPATION LIMITATIONS: cleaning, shopping, and community activity  PERSONAL FACTORS: Age, Fitness, Past/current experiences, and Time since onset of injury/illness/exacerbation are also affecting patient's functional outcome.   REHAB POTENTIAL: Good  CLINICAL DECISION MAKING: Evolving/moderate complexity  EVALUATION COMPLEXITY: Moderate   GOALS: Goals reviewed with patient? Yes  SHORT TERM  GOALS: Target date: 02/14/2023   Pt will be ind with initial HEP Baseline: Goal status: INITIAL  2.  Pt will have improved 5x STS to </=13 sec to demo increasing functional LE strength Baseline:  Goal status: INITIAL  3.  Pt will demo hip extension ROM to at least 10 deg bilat for improved trunk extension and posture Baseline:  Goal status: INITIAL   LONG TERM GOALS: Target date: 03/14/2023   Pt will be ind with management and progression of HEP Baseline:  Goal status: INITIAL  2.  Pt will have improved 2 minute walk test to at least 400' to be closer to her age norms Baseline: 53' Goal status: INITIAL  3.  Pt will demo at least 4+/5 bilat LE strength for improved standing tolerance and stability Baseline:  Goal status: INITIAL  4.  Pt will have improved FOTO score to >/=50 Baseline:  Goal status: INITIAL  5.  Pt will report improved comfort with sleep by >/=50% Baseline:  Goal status: INITIAL   PLAN:  PT FREQUENCY: 2x/week  PT DURATION: 8 weeks  PLANNED INTERVENTIONS: Therapeutic exercises, Therapeutic activity, Neuromuscular re-education, Balance training, Gait training, Patient/Family education, Self Care, Joint mobilization, Stair training, Aquatic Therapy, Dry Needling, Electrical stimulation, Spinal mobilization, Cryotherapy, Moist heat, Taping, Vasopneumatic device, Ionotophoresis 4mg /ml Dexamethasone, Manual therapy, and Re-evaluation.  PLAN FOR NEXT SESSION: Assess response to HEP. Work on hip and back stretching/mobility. Work on hip and core strengthening. Work on glute activation and initiate balance exercises.    Seymour Bars, PT 01/23/2023, 1:07 PM

## 2023-01-30 ENCOUNTER — Ambulatory Visit (HOSPITAL_COMMUNITY): Payer: Medicare Other

## 2023-01-30 DIAGNOSIS — R293 Abnormal posture: Secondary | ICD-10-CM

## 2023-01-30 DIAGNOSIS — R2689 Other abnormalities of gait and mobility: Secondary | ICD-10-CM

## 2023-01-30 DIAGNOSIS — M6281 Muscle weakness (generalized): Secondary | ICD-10-CM | POA: Diagnosis not present

## 2023-01-30 DIAGNOSIS — R2681 Unsteadiness on feet: Secondary | ICD-10-CM

## 2023-01-30 NOTE — Therapy (Signed)
OUTPATIENT PHYSICAL THERAPY THORACOLUMBAR EVALUATION   Patient Name: Colleen Aguilar MRN: [id number] DOB:Uri 26, 1947, 77 y.o., female Today's Date: 01/30/2023  END OF SESSION:  PT End of Session - 01/30/23 1507     Visit Number 3    Number of Visits 16    Date for PT Re-Evaluation 03/14/23    Authorization Type Medicare    Progress Note Due on Visit 10    PT Start Time 0310    PT Stop Time 0350    PT Time Calculation (min) 40 min    Activity Tolerance Patient tolerated treatment well    Behavior During Therapy WFL for tasks assessed/performed             Past Medical History:  Diagnosis Date   Asthma    ? dx  clinical dx unable to do pfts response to inhalers   Diabetes mellitus    DM (diabetes mellitus), type 2 (HCC) 06/21/2017   Dysphagia    psychogenic   Hiatal hernia    egd 2007   History of colonoscopy    2003   Mental retardation    special needs lives with mom   Past Surgical History:  Procedure Laterality Date   ABDOMINAL HYSTERECTOMY     ESOPHAGOGASTRODUODENOSCOPY  2007   TOTAL HIP ARTHROPLASTY Left 06/21/2017   Procedure: TOTAL HIP ARTHROPLASTY ANTERIOR APPROACH LEFT;  Surgeon: Samson Frederic, MD;  Location: [facility];  Service: Orthopedics;  Laterality: Left;   Patient Active Problem List   Diagnosis Date Noted   Vitamin B12 deficiency 06/22/2017   Postoperative anemia due to acute blood loss 06/22/2017   DM (diabetes mellitus), type 2 (HCC) 06/21/2017   Displaced fracture of left femoral neck (HCC) 06/21/2017   Left displaced femoral neck fracture (HCC)    Hip fracture requiring operative repair, left, closed, initial encounter (HCC) 06/20/2017   Acute venous embolism and thrombosis of deep vessels of proximal lower extremity (HCC) 06/20/2017   Need for lipid screening 08/12/2010   Underweight 08/12/2010   Chronic headaches 08/12/2010   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   FLATULENCE ERUCTATION AND GAS PAIN 02/03/2009   DIARRHEA 02/03/2009   ADVERSE  REACTION TO MEDICATION 02/03/2009   Headache 10/13/2008   URINARY INCONTINENCE 10/13/2008   CERUMEN IMPACTION 09/21/2008   HEMATURIA UNSPECIFIED 09/21/2008   OTHER ANXIETY STATES 03/16/2008   ELBOW INJURY 03/16/2008   UNSPECIFIED MENTAL RETARDATION 11/15/2007   CHEST WALL PAIN, ANTERIOR 11/15/2007   Asthma 12/04/2006    PCP: Christene Lye, FNP  REFERRING PROVIDER: Sherryl Manges, NP  REFERRING DIAG: M54.16 (ICD-10-CM) - Radiculopathy, lumbar region  Rationale for Evaluation and Treatment: Rehabilitation  THERAPY DIAG:  Muscle weakness (generalized)  Abnormal posture  Other abnormalities of gait and mobility  Unsteadiness on feet  ONSET DATE: 1.5 years  SUBJECTIVE:  SUBJECTIVE STATEMENT:  Today: Patient states 8/10 pain today in the low back. As per subjective, patient had a recent fall ~ 3 weeks ago onto the right hip and the pain has not decreased   IE: Pt reports she has a pinched nerve in her back. Pt states pain is more on the right side of her back. Pt states it's painful to sleep. Pt reports it keeps her from walking. Can only walk a couple of hours. At night when she goes to the bathroom it hurts the most. Feels she can only sit for about 1.5 hours. Pt lives in ALF. Pt states she is scared to stand by herself (especially in shower) due to fear of falls.  *Patient reports she has a PMH of 19+ falls.  PERTINENT HISTORY:  R pelvic fracture 10 years ago (feels it has never healed all the way)  PAIN:  Are you having pain? Yes: NPRS scale: Currently 7, at worst 10/10 Pain location: R low back Pain description: Sharp Aggravating factors: Weather, increased time walking, standing or sitting Relieving factors: Sometimes resting it can help  PRECAUTIONS: None  RED  FLAGS: None   WEIGHT BEARING RESTRICTIONS: No  FALLS:  Has patient fallen in last 6 months? No  LIVING ENVIRONMENT: Lives in [facility] Has following equipment at home: Dan Humphreys - 4 wheeled  OCCUPATION: Retired. Wants to participate in more walking (3 miles at her church)  PLOF: Independent  PATIENT GOALS: Decrease back pain with activities  NEXT MD VISIT: n/a  OBJECTIVE:  Note: Objective measures were completed at Evaluation unless otherwise noted.  DIAGNOSTIC FINDINGS:  MRI lumbar spine 12/06/22 IMPRESSION: 1. Scoliotic curvature convex to the left with the apex at L2. 2. L3-4: Bulging of the disc more prominent in the left posterolateral to foraminal region. Mild facet and ligamentous hypertrophy left more than right. Stenosis of the left lateral recess and the intervertebral foramen on the left that could cause neural compression. 3. L4-5: Bulging of the disc. Mild facet and ligamentous hypertrophy. Mild narrowing of the left lateral recess and foramen but without likely neural compression at this level. 4. L5-S1: Mild bulging of the disc. Mild facet osteoarthritis. No stenosis.  PATIENT SURVEYS:  FOTO 41; predicted 50  SCREENING FOR RED FLAGS: Bowel or bladder incontinence: No Spinal tumors: No Cauda equina syndrome: No Compression fracture: No Abdominal aneurysm: No  COGNITION: Overall cognitive status: Within functional limits for tasks assessed     SENSATION: WFL  MUSCLE LENGTH: Hamstrings: Right 45 deg; Left 45 deg Thomas test: Right 5 deg; Left 0 deg  POSTURE: rounded shoulders and flexed trunk   PALPATION: Bilat lumbar paraspinal increased tone  LUMBAR ROM:   AROM eval  Flexion 80%  Extension 10% *  Right lateral flexion To knee but also has to come slightly towards forward flexion *  Left lateral flexion To knee but also has to come slightly towards forward flexion  Right rotation 25%*  Left rotation 25%*   (Blank rows = not  tested, * = pain)  LOWER EXTREMITY ROM:     Active  Right eval Left eval  Hip flexion    Hip extension    Hip abduction    Hip adduction    Hip internal rotation    Hip external rotation    Knee flexion    Knee extension    Ankle dorsiflexion    Ankle plantarflexion    Ankle inversion    Ankle eversion     (  Blank rows = not tested)  LOWER EXTREMITY MMT:    MMT Right eval Left eval  Hip flexion 3+ 3+  Hip extension 3- 2+  Hip abduction 3- 2+  Hip adduction    Hip internal rotation    Hip external rotation    Knee flexion 4 4  Knee extension 4 4-  Ankle dorsiflexion    Ankle plantarflexion    Ankle inversion    Ankle eversion     (Blank rows = not tested)  LUMBAR SPECIAL TESTS:  Straight leg raise test: Positive and FABER test: Positive  FUNCTIONAL TESTS:  5 times sit to stand: 14.14 sec (but unable to come up into full trunk and hip extension) 2 min walk 192' with rollator  GAIT: Distance walked: Around clinic Assistive device utilized: Environmental consultant - 4 wheeled Level of assistance: SBA Comments: Increased flexed posture, reduced hip extension  TODAY'S TREATMENT:                                                                                                                              DATE:  01/30/23 Seated UBE NuStep Self/Care/ADLs  Positioning, activity modification, pain management     01/23/23 Supine Lower Trunk Rotation  Supine hip isometrics Supine Single Knee to Chest Stretch  Supine Double Knee to Chest   Left sidelying clamshells   01/17/23  See HEP below   PATIENT EDUCATION:  Education details: Exam findings, POC, initial HEP Person educated: Patient Education method: Explanation, Demonstration, and Handouts Education comprehension: verbalized understanding, returned demonstration, and needs further education  HOME EXERCISE PROGRAM: Access Code: [email or web address] URL: [email or web address] Date: 01/23/2023 Prepared by: Seymour Bars  Exercises - Supine Lower Trunk Rotation  - 1 sets - 10 reps - Supine Single Knee to Chest Stretch  - 1 x daily - 10 reps - Supine Double Knee to Chest  - 1 x daily - 10 reps  ASSESSMENT:  CLINICAL IMPRESSION: As per subjective, patient reports she had a fall ~3 weeks ago on the right hip and symptoms have not improved with PT. PT attempted to contact patient's referring provider and left voicemail with recommendation of further x-ray/imaging to right hip to rule out any fracture before continuing with PT. Patient will continue to benefit from PT to improve QoL and decrease Falls Risk    IE: Patient is a 77 y.o. F who was seen today for physical therapy evaluation and treatment for chronic back pain. PMH significant for R pelvic hip fracture and fear of falls. Assessment significant for decreased bilat hip and lumbar ROM, shortened hamstrings and hip flexors, abnormal posture, and gross LE weakness affecting activities such as sleeping, standing, transfers and amb limiting participation in home and community tasks. Pt is a resident of ALF and has fear of falling while in the shower. Pt will benefit from PT to address these issues for improved ability to perform home and community tasks.   OBJECTIVE IMPAIRMENTS: Abnormal gait, decreased  activity tolerance, decreased balance, decreased endurance, decreased mobility, difficulty walking, decreased ROM, decreased strength, hypomobility, increased fascial restrictions, impaired flexibility, improper body mechanics, postural dysfunction, and pain.   ACTIVITY LIMITATIONS: carrying, lifting, bending, sitting, standing, squatting, sleeping, stairs, transfers, bed mobility, bathing, toileting, hygiene/grooming, and locomotion level  PARTICIPATION LIMITATIONS: cleaning, shopping, and community activity  PERSONAL FACTORS: Age, Fitness, Past/current experiences, and Time since onset of injury/illness/exacerbation are also affecting patient's  functional outcome.   REHAB POTENTIAL: Good  CLINICAL DECISION MAKING: Evolving/moderate complexity  EVALUATION COMPLEXITY: Moderate   GOALS: Goals reviewed with patient? Yes  SHORT TERM GOALS: Target date: 02/14/2023   Pt will be ind with initial HEP Baseline: Goal status: INITIAL  2.  Pt will have improved 5x STS to </=13 sec to demo increasing functional LE strength Baseline:  Goal status: INITIAL  3.  Pt will demo hip extension ROM to at least 10 deg bilat for improved trunk extension and posture Baseline:  Goal status: INITIAL   LONG TERM GOALS: Target date: 03/14/2023   Pt will be ind with management and progression of HEP Baseline:  Goal status: INITIAL  2.  Pt will have improved 2 minute walk test to at least 400' to be closer to her age norms Baseline: 86' Goal status: INITIAL  3.  Pt will demo at least 4+/5 bilat LE strength for improved standing tolerance and stability Baseline:  Goal status: INITIAL  4.  Pt will have improved FOTO score to >/=50 Baseline:  Goal status: INITIAL  5.  Pt will report improved comfort with sleep by >/=50% Baseline:  Goal status: INITIAL   PLAN:  PT FREQUENCY: 2x/week  PT DURATION: 8 weeks  PLANNED INTERVENTIONS: Therapeutic exercises, Therapeutic activity, Neuromuscular re-education, Balance training, Gait training, Patient/Family education, Self Care, Joint mobilization, Stair training, Aquatic Therapy, Dry Needling, Electrical stimulation, Spinal mobilization, Cryotherapy, Moist heat, Taping, Vasopneumatic device, Ionotophoresis 4mg /ml Dexamethasone, Manual therapy, and Re-evaluation.  PLAN FOR NEXT SESSION: Assess response to HEP. Work on hip and back stretching/mobility. Work on hip and core strengthening. Work on glute activation and initiate balance exercises.    Seymour Bars, PT 01/30/2023, 3:08 PM

## 2023-01-31 ENCOUNTER — Encounter (HOSPITAL_COMMUNITY): Payer: Medicare Other

## 2023-02-06 ENCOUNTER — Encounter (HOSPITAL_COMMUNITY): Payer: Medicare Other

## 2023-02-07 ENCOUNTER — Encounter (HOSPITAL_COMMUNITY): Payer: Medicare Other

## 2023-02-13 ENCOUNTER — Encounter (HOSPITAL_COMMUNITY): Payer: Medicare Other

## 2023-02-14 ENCOUNTER — Encounter (HOSPITAL_COMMUNITY): Payer: Medicare Other

## 2023-02-20 ENCOUNTER — Encounter (HOSPITAL_COMMUNITY): Payer: Medicare Other

## 2023-02-20 ENCOUNTER — Encounter (HOSPITAL_COMMUNITY): Payer: Self-pay

## 2023-02-20 NOTE — Therapy (Signed)
PT called Assisted Living Facility where patient stays and spoke to caretaker of patient. PT reminded caretaker to follow-up with MD for further imaging to rule out hip fracture before continuing PT ( patient claims she had a bad fall). PT cancelled future appointments until patient cleared for PT.   [facility] Rehabilitation at [facility] [address] [place], [place], [address] Phone: [phone]   Fax:  [phone]  Patient Details  Name: Colleen Aguilar MRN: [id number] Date of Birth: Feb 19, 1946 Referring Provider:  No ref. provider found  Encounter Date: 02/20/2023   Seymour Bars, PT 02/20/2023, 11:48 AM  [facility] Outpatient Rehabilitation at [facility] [address] [place], [place], [address] Phone: [phone]   Fax:  [phone]

## 2023-02-21 ENCOUNTER — Encounter (HOSPITAL_COMMUNITY): Payer: Medicare Other

## 2023-02-27 ENCOUNTER — Encounter (HOSPITAL_COMMUNITY): Payer: Medicare Other

## 2023-02-28 ENCOUNTER — Encounter (HOSPITAL_COMMUNITY): Payer: Medicare Other

## 2023-07-25 ENCOUNTER — Emergency Department (HOSPITAL_COMMUNITY)
Admission: EM | Admit: 2023-07-25 | Discharge: 2023-07-25 | Disposition: A | Discharge status: Home / Self Care | Attending: Emergency Medicine | Admitting: Emergency Medicine

## 2023-07-25 ENCOUNTER — Other Ambulatory Visit: Payer: Self-pay

## 2023-07-25 ENCOUNTER — Encounter (HOSPITAL_COMMUNITY): Payer: Self-pay | Admitting: Emergency Medicine

## 2023-07-25 DIAGNOSIS — R1030 Lower abdominal pain, unspecified: Secondary | ICD-10-CM | POA: Diagnosis present

## 2023-07-25 DIAGNOSIS — Z7901 Long term (current) use of anticoagulants: Secondary | ICD-10-CM | POA: Diagnosis not present

## 2023-07-25 DIAGNOSIS — N390 Urinary tract infection, site not specified: Secondary | ICD-10-CM

## 2023-07-25 DIAGNOSIS — N3000 Acute cystitis without hematuria: Secondary | ICD-10-CM | POA: Insufficient documentation

## 2023-07-25 LAB — URINALYSIS, ROUTINE W REFLEX MICROSCOPIC
Bacteria, UA: NONE SEEN
Bilirubin Urine: NEGATIVE
Glucose, UA: NEGATIVE mg/dL
Ketones, ur: NEGATIVE mg/dL
Nitrite: NEGATIVE
Protein, ur: 30 mg/dL — AB
Specific Gravity, Urine: 1.014 (ref 1.005–1.030)
WBC, UA: >50 WBC/hpf (ref 0–5)
pH: 6 (ref 5.0–8.0)

## 2023-07-25 LAB — COMPREHENSIVE METABOLIC PANEL WITH GFR
ALT: 11 U/L (ref 0–44)
AST: 40 U/L (ref 15–41)
Albumin: 3.2 g/dL — ABNORMAL LOW (ref 3.5–5.0)
Alkaline Phosphatase: 106 U/L (ref 38–126)
Anion gap: 14 (ref 5–15)
BUN: 11 mg/dL (ref 8–23)
CO2: 23 mmol/L (ref 22–32)
Calcium: 8.6 mg/dL — ABNORMAL LOW (ref 8.9–10.3)
Chloride: 97 mmol/L — ABNORMAL LOW (ref 98–111)
Creatinine, Ser: 0.74 mg/dL (ref 0.44–1.00)
GFR, Estimated: >60 mL/min (ref >60)
Glucose, Bld: 116 mg/dL — ABNORMAL HIGH (ref 70–99)
Potassium: 3 mmol/L — ABNORMAL LOW (ref 3.5–5.1)
Sodium: 134 mmol/L — ABNORMAL LOW (ref 135–145)
Total Bilirubin: 0.8 mg/dL (ref 0.0–1.2)
Total Protein: 6.7 g/dL (ref 6.5–8.1)

## 2023-07-25 LAB — CBC
HCT: 35.4 % — ABNORMAL LOW (ref 36.0–46.0)
Hemoglobin: 12 g/dL (ref 12.0–15.0)
MCH: 30.8 pg (ref 26.0–34.0)
MCHC: 33.9 g/dL (ref 30.0–36.0)
MCV: 90.8 fL (ref 80.0–100.0)
Platelets: 388 10*3/uL (ref 150–400)
RBC: 3.9 MIL/uL (ref 3.87–5.11)
RDW: 13.6 % (ref 11.5–15.5)
WBC: 9.7 10*3/uL (ref 4.0–10.5)
nRBC: 0 % (ref 0.0–0.2)

## 2023-07-25 MED ORDER — POTASSIUM CHLORIDE CRYS ER 20 MEQ PO TBCR: 20.0000 meq | EXTENDED_RELEASE_TABLET | Freq: Two times a day (BID) | ORAL | 0 refills | Status: AC

## 2023-07-25 MED ORDER — POTASSIUM CHLORIDE 20 MEQ PO PACK
20.0000 meq | PACK | Freq: Once | ORAL | Status: AC
  Administered 2023-07-25: 20 meq via ORAL
  Filled 2023-07-25: qty 1

## 2023-07-25 MED ORDER — CEPHALEXIN 500 MG PO CAPS: 500.0000 mg | ORAL_CAPSULE | Freq: Two times a day (BID) | ORAL | 0 refills | Status: AC

## 2023-07-25 NOTE — ED Triage Notes (Signed)
 Pt from [facility] via [facility] with reports of AMS and incontinence that started yesterday. Per EMS at pts baseline she is oriented x 4 and ambulatory.

## 2023-07-25 NOTE — ED Notes (Signed)
 Talked to [name] at [facility] about patient, prognosis, and her medication change.

## 2023-07-25 NOTE — ED Provider Notes (Signed)
 [facility]PART[facility] AT [facility] Provider Note   CSN: [id number] Arrival date & time: 07/25/23  1159     History  Chief Complaint  Patient presents with   Altered Mental Status    Colleen Aguilar is a 78 y.o. female.  She comes here today by EMS as the SNF she is living and is concerned for potential UTI as she has what they described as altered mental status as well as noting bright red in her brief.  EMS noted that she had lower abdominal pain in which they managed with IV Toradol in transit to to the hospital.  Upon arrival to the hospital she no longer complains of abdominal pain and is alert.  SNF also noted that the patient has been incontinent of urine as of yesterday.   Altered Mental Status Associated symptoms: weakness   Associated symptoms: no nausea and no vomiting        Home Medications Prior to Admission medications   Medication Sig Start Date End Date Taking? Authorizing Provider  acetaminophen (TYLENOL) 500 MG tablet Take 500 mg by mouth daily as needed.    [provider]  albuterol (VENTOLIN HFA) 108 (90 BASE) MCG/ACT inhaler Inhale 2 puffs into the lungs every 6 (six) hours as needed. 12/27/10   Panosh, Neta Mends, MD  alendronate (FOSAMAX) 35 MG tablet Take 35 mg by mouth every 7 (seven) days. Take with a full glass of water on an empty stomach.    [provider]  apixaban (ELIQUIS) 5 MG TABS tablet Take 5 mg by mouth 2 (two) times daily.    [provider]  atorvastatin (LIPITOR) 10 MG tablet Take 10 mg by mouth daily. 08/01/22   [provider]  budesonide-formoterol (SYMBICORT) 160-4.5 MCG/ACT inhaler Inhale 2 puffs into the lungs 2 (two) times daily.    [provider]  calcium-vitamin D (OSCAL WITH D) 500-200 MG-UNIT per tablet Take 1 tablet by mouth 2 (two) times daily.    [provider]  carboxymethylcellulose (REFRESH PLUS) 0.5 % SOLN Place 1 drop into both eyes 3 (three) times daily  as needed.    [provider]  cyanocobalamin (,VITAMIN B-12,) 1000 MCG/ML injection Inject 1 mL (1,000 mcg total) into the muscle daily. 1000 MCG's IM daily times 3 days, then IM weekly times 1 month then 1000 MCG's IM monthly. Patient not taking: Reported on 08/25/2022 06/26/17   Rodolph Bong, MD  docusate sodium (COLACE) 100 MG capsule Take 100 mg by mouth daily.    [provider]  donepezil (ARICEPT) 10 MG tablet Take 10 mg by mouth at bedtime.    [provider]  fluticasone (FLONASE) 50 MCG/ACT nasal spray Place 2 sprays into both nostrils daily. 06/26/17   Rodolph Bong, MD  furosemide (LASIX) 20 MG tablet Take 20 mg by mouth daily. 08/07/22   [provider]  gabapentin (NEURONTIN) 300 MG capsule Take by mouth. 08/01/22   [provider]  loratadine (CLARITIN) 10 MG tablet Take 1 tablet (10 mg total) by mouth daily. Patient not taking: Reported on 08/25/2022 06/26/17   Rodolph Bong, MD  LORazepam (ATIVAN) 0.5 MG tablet Take 0.5 tablets (0.25 mg total) by mouth 2 (two) times daily. 06/25/17   Rodolph Bong, MD  menthol-cetylpyridinium (CEPACOL) 3 MG lozenge Take 1 lozenge (3 mg total) by mouth as needed for sore throat (sore throat). Patient not taking: Reported on 08/25/2022 06/25/17   Ramiro Harvest  V, MD  metFORMIN (GLUCOPHAGE) 500 MG tablet Take 250 mg by mouth 2 (two) times daily with a meal.     [provider]  Nutritional Supplements (NUTRITIONAL SHAKE PO) Take 1 each by mouth 3 (three) times daily. Patient not taking: Reported on 08/25/2022    [provider]  oxyBUTYnin Chloride 2.5 MG TABS Take by mouth. 08/01/22   [provider]  senna (SENOKOT) 8.6 MG TABS tablet Take 1 tablet (8.6 mg total) by mouth 2 (two) times daily. 06/25/17   Rodolph Bong, MD  sertraline (ZOLOFT) 25 MG tablet Take 37.5 mg by mouth daily.     [provider]  traMADol (ULTRAM) 50 MG tablet Take 50 mg  by mouth 2 (two) times daily as needed for moderate pain.    [provider]      Allergies    Patient has no known allergies.    Review of Systems   Review of Systems  Gastrointestinal:  Negative for diarrhea, nausea and vomiting.  Genitourinary:  Positive for dysuria, hematuria and urgency.  Neurological:  Positive for weakness.    Physical Exam Updated Vital Signs BP (!) 127/54 (BP Location: Right Arm)   Pulse 70   Temp 98.8 F (37.1 C) (Oral)   Resp 20   SpO2 94%  Physical Exam Vitals and nursing note reviewed.  Constitutional:      General: She is not in acute distress.    Appearance: Normal appearance.  HENT:     Head: Normocephalic and atraumatic.     Mouth/Throat:     Mouth: Mucous membranes are moist.     Pharynx: Oropharynx is clear.  Eyes:     Extraocular Movements: Extraocular movements intact.     Conjunctiva/sclera: Conjunctivae normal.     Pupils: Pupils are equal, round, and reactive to light.  Cardiovascular:     Rate and Rhythm: Normal rate and regular rhythm.     Pulses: Normal pulses.     Heart sounds: Normal heart sounds. No murmur heard.    No friction rub. No gallop.  Pulmonary:     Effort: Pulmonary effort is normal.     Breath sounds: Normal breath sounds.  Abdominal:     General: Abdomen is flat. Bowel sounds are normal.     Palpations: Abdomen is soft.  Genitourinary:    Rectum: Normal. Guaiac result negative.     Comments: No frank blood on rectal examination and Hemoccult is negative Musculoskeletal:        General: Normal range of motion.     Cervical back: Normal range of motion and neck supple.     Right lower leg: No edema.     Left lower leg: No edema.  Skin:    General: Skin is warm and dry.     Capillary Refill: Capillary refill takes less than 2 seconds.  Neurological:     General: No focal deficit present.     Mental Status: She is alert.     Cranial Nerves: No cranial nerve deficit.     Sensory: No sensory  deficit.     Comments: Oriented to person and place, lacks orientation to time or event.  Psychiatric:        Mood and Affect: Mood normal.     ED Results / Procedures / Treatments   Labs (all labs ordered are listed, but only abnormal results are displayed) Labs Reviewed  URINALYSIS, ROUTINE W REFLEX MICROSCOPIC  CBC  COMPREHENSIVE METABOLIC PANEL WITH  GFR    EKG None  Radiology No results found.  Procedures Procedures    Medications Ordered in ED Medications - No data to display  ED Course/ Medical Decision Making/ A&P                                 Medical Decision Making Given findings on her urinalysis and with physical exam findings of abdominal tenderness and pain, suspect that this is an acute cystitis.  Further noted that she has depressed potassium levels at 3.0.  Will manage this with oral potassium and further manage cystitis with oral antibiotics as noted.  Will have patient follow-up with primary care to follow-up on potassium levels.  Also follow-up with primary care for monitoring resolution of cystitis.  Amount and/or Complexity of Data Reviewed Labs: ordered.  Risk Prescription drug management.          Final Clinical Impression(s) / ED Diagnoses Final diagnoses:  None    Rx / DC Orders ED Discharge Orders     None         Harold Hedge, PA 07/25/23 1358    Eber Hong, MD 07/26/23 [id number]

## 2023-07-25 NOTE — ED Notes (Incomplete)
 Talked to [name] at [facility] about patient, prognosis, and her medication change. They will send someone to pick her up, but

## 2023-07-25 NOTE — Discharge Instructions (Addendum)
 Patient needs to follow-up with primary care concerning following potassium levels.  Utilize antibiotics as prescribed and if symptoms fail to improve or symptoms worsen return to this department for reevaluation and management.

## 2023-07-28 LAB — URINE CULTURE
Culture: >=100000 — AB
Special Requests: NORMAL

## 2023-07-29 ENCOUNTER — Telehealth (HOSPITAL_BASED_OUTPATIENT_CLINIC_OR_DEPARTMENT_OTHER): Payer: Self-pay

## 2023-07-29 NOTE — Telephone Encounter (Signed)
 Post ED Visit - Positive Culture Follow-up  Culture report reviewed by antimicrobial stewardship pharmacist: [facility] [x]  Dionicio Fray, Pharm.D. []  Skeet Duke, Pharm.D., BCPS AQ-ID []  Leslee Rase, Pharm.D., BCPS []  Garland Junk, Pharm.D., BCPS []  [place], [place].D., BCPS, AAHIVP []  Alcide Aly, Pharm.D., BCPS, AAHIVP []  Jerri Morale, PharmD, BCPS []  Graham Laws, PharmD, BCPS []  Cleda Curly, PharmD, BCPS []  Tamar Fairly, PharmD []  Ballard Levels, PharmD, BCPS []  Ollen Beverage, PharmD  [facility] []  Arlyne Bering, PharmD []  Sherryle Don, PharmD []  Van Gelinas, PharmD []  Delila Felty, Rph []  Luna [place]) Cleora Daft, PharmD []  Augustina Block, PharmD []  Arie Kurtz, PharmD []  Sharlyn Deaner, PharmD []  Agnes Hose, PharmD []  Kendall Pauls, PharmD []  Gladstone Lamer, PharmD []  Armanda Bern, PharmD []  Tera Fellows, PharmD   Positive urine culture Treated with Cephalexin, organism sensitive to the same and no further patient follow-up is required at this time.  Delena Feil 07/29/2023, 3:38 PM

## 2023-08-29 ENCOUNTER — Ambulatory Visit (INDEPENDENT_AMBULATORY_CARE_PROVIDER_SITE_OTHER): Admitting: Otolaryngology

## 2023-08-29 ENCOUNTER — Encounter (INDEPENDENT_AMBULATORY_CARE_PROVIDER_SITE_OTHER): Payer: Self-pay | Admitting: Otolaryngology

## 2023-08-29 VITALS — BP 110/73 | HR 66 | Ht 62.0 in | Wt 112.0 lb

## 2023-08-29 DIAGNOSIS — H6123 Impacted cerumen, bilateral: Secondary | ICD-10-CM

## 2023-08-29 DIAGNOSIS — R0981 Nasal congestion: Secondary | ICD-10-CM | POA: Diagnosis not present

## 2023-08-29 DIAGNOSIS — J343 Hypertrophy of nasal turbinates: Secondary | ICD-10-CM | POA: Diagnosis not present

## 2023-08-29 DIAGNOSIS — J310 Chronic rhinitis: Secondary | ICD-10-CM | POA: Diagnosis not present

## 2023-08-29 DIAGNOSIS — R519 Headache, unspecified: Secondary | ICD-10-CM

## 2023-08-31 DIAGNOSIS — J343 Hypertrophy of nasal turbinates: Secondary | ICD-10-CM | POA: Insufficient documentation

## 2023-08-31 DIAGNOSIS — J310 Chronic rhinitis: Secondary | ICD-10-CM | POA: Insufficient documentation

## 2023-08-31 DIAGNOSIS — H6123 Impacted cerumen, bilateral: Secondary | ICD-10-CM | POA: Insufficient documentation

## 2023-08-31 NOTE — Progress Notes (Signed)
 CC: Sinus headaches, nasal congestion, nasal drainage  HPI:  Colleen Aguilar is a 78 y.o. female who presents today with her caregiver.  According to the patient, she has been experiencing recurrent headaches for the past 2 years.  She attributes her headaches to recurrent sinusitis.  She complains of frequent nasal congestion, nasal drainage, and facial pain.  She was recently treated with Augmentin.  She is also on Flonase  and Zyrtec.  Currently she denies any fever or visual change.  She has no previous history of ENT surgery.  Past Medical History:  Diagnosis Date   Asthma    ? dx  clinical dx unable to do pfts response to inhalers   Diabetes mellitus    DM (diabetes mellitus), type 2 (HCC) 06/21/2017   Dysphagia    psychogenic   Hiatal hernia    egd 2007   History of colonoscopy    2003   Mental retardation    special needs lives with mom    Past Surgical History:  Procedure Laterality Date   ABDOMINAL HYSTERECTOMY     ESOPHAGOGASTRODUODENOSCOPY  2007   TOTAL HIP ARTHROPLASTY Left 06/21/2017   Procedure: TOTAL HIP ARTHROPLASTY ANTERIOR APPROACH LEFT;  Surgeon: Adonica Hoose, MD;  Location: [facility];  Service: Orthopedics;  Laterality: Left;    Family History  Problem Relation Age of Onset   Diabetes Mother    Hyperlipidemia Mother    Other Sister        neurologic disease died from ? prion disease    Social History:  reports that she has never smoked. She has never used smokeless tobacco. She reports that she does not drink alcohol  and does not use drugs.  Allergies: No Known Allergies  Prior to Admission medications   Medication Sig Start Date End Date Taking? Authorizing Provider  acetaminophen  (TYLENOL ) 500 MG tablet Take 500 mg by mouth daily as needed.   Yes [provider]  albuterol  (VENTOLIN  HFA) 108 (90 BASE) MCG/ACT inhaler Inhale 2 puffs into the lungs every 6 (six) hours as needed. 12/27/10  Yes Panosh, Wanda K, MD  alendronate (FOSAMAX) 35 MG tablet Take  35 mg by mouth every 7 (seven) days. Take with a full glass of water  on an empty stomach.   Yes [provider]  apixaban  (ELIQUIS ) 5 MG TABS tablet Take 5 mg by mouth 2 (two) times daily.   Yes [provider]  atorvastatin (LIPITOR) 10 MG tablet Take 10 mg by mouth daily. 08/01/22  Yes [provider]  budesonide-formoterol  (SYMBICORT) 160-4.5 MCG/ACT inhaler Inhale 2 puffs into the lungs 2 (two) times daily.   Yes [provider]  calcium -vitamin D  (OSCAL WITH D) 500-200 MG-UNIT per tablet Take 1 tablet by mouth 2 (two) times daily.   Yes [provider]  carboxymethylcellulose (REFRESH PLUS) 0.5 % SOLN Place 1 drop into both eyes 3 (three) times daily as needed.   Yes [provider]  cyanocobalamin  (,VITAMIN B-12,) 1000 MCG/ML injection Inject 1 mL (1,000 mcg total) into the muscle daily. 1000 MCG's IM daily times 3 days, then 1000mcg IM weekly times 1 month then 1000 MCG's IM monthly. 06/26/17  Yes Armenta Landau, MD  docusate sodium  (COLACE) 100 MG capsule Take 100 mg by mouth daily.   Yes [provider]  donepezil  (ARICEPT ) 10 MG tablet Take 10 mg by mouth at bedtime.   Yes [provider]  fluticasone  (FLONASE ) 50 MCG/ACT nasal spray Place 2 sprays into both nostrils daily.  06/26/17  Yes Armenta Landau, MD  furosemide  (LASIX ) 20 MG tablet Take 20 mg by mouth daily. 08/07/22  Yes [provider]  gabapentin (NEURONTIN) 300 MG capsule Take by mouth. 08/01/22  Yes [provider]  loratadine  (CLARITIN ) 10 MG tablet Take 1 tablet (10 mg total) by mouth daily. 06/26/17  Yes Armenta Landau, MD  LORazepam  (ATIVAN ) 0.5 MG tablet Take 0.5 tablets (0.25 mg total) by mouth 2 (two) times daily. 06/25/17  Yes Armenta Landau, MD  menthol -cetylpyridinium (CEPACOL) 3 MG lozenge Take 1 lozenge (3 mg total) by mouth as needed for sore throat (sore throat). 06/25/17  Yes Armenta Landau, MD  metFORMIN  (GLUCOPHAGE) 500 MG tablet Take 250 mg by mouth 2 (two) times daily with a meal.    Yes [provider]  Nutritional Supplements (NUTRITIONAL SHAKE PO) Take 1 each by mouth 3 (three) times daily.   Yes [provider]  oxyBUTYnin Chloride 2.5 MG TABS Take by mouth. 08/01/22  Yes [provider]  senna (SENOKOT) 8.6 MG TABS tablet Take 1 tablet (8.6 mg total) by mouth 2 (two) times daily. 06/25/17  Yes Armenta Landau, MD  sertraline  (ZOLOFT ) 25 MG tablet Take 37.5 mg by mouth daily.    Yes [provider]  traMADol (ULTRAM) 50 MG tablet Take 50 mg by mouth 2 (two) times daily as needed for moderate pain.   Yes [provider]  potassium chloride  SA (KLOR-CON  M) 20 MEQ tablet Take 1 tablet (20 mEq total) by mouth 2 (two) times daily for 3 days. 07/25/23 07/28/23  Juanetta Nordmann, PA    Blood pressure 110/73, pulse 66, height 5\' 2"  (1.575 m), weight 112 lb (50.8 kg), SpO2 95%. Exam: General: Communicates without difficulty, well nourished, no acute distress. Head: Normocephalic, no evidence injury, no tenderness, facial buttresses intact without stepoff. Face/sinus: No tenderness to palpation and percussion. Facial movement is normal and symmetric. Eyes: PERRL, EOMI. No scleral icterus, conjunctivae clear. Neuro: CN II exam reveals vision grossly intact.  No nystagmus at any point of gaze. Ears: Auricles well formed without lesions.  Bilateral cerumen impaction.  Nose: External evaluation reveals normal support and skin without lesions.  Dorsum is intact.  Anterior rhinoscopy reveals congested mucosa over anterior aspect of inferior turbinates and intact septum.  No purulence noted. Oral:  Oral cavity and oropharynx are intact, symmetric, without erythema or edema.  Mucosa is moist without lesions. Neck: Full range of motion without pain.  There is no significant lymphadenopathy.  No masses palpable.  Thyroid bed within normal limits to palpation.  Parotid  glands and submandibular glands equal bilaterally without mass.  Trachea is midline. Neuro:  CN 2-12 grossly intact.   Procedure:  Flexible Nasal Endoscopy: Description: Risks, benefits, and alternatives of flexible endoscopy were explained to the patient.  Specific mention was made of the risk of throat numbness with difficulty swallowing, possible bleeding from the nose and mouth, and pain from the procedure.  The patient gave oral consent to proceed.  The flexible scope was inserted into the right nasal cavity.  Endoscopy of the interior nasal cavity, superior, inferior, and middle meatus was performed. The sphenoid-ethmoid recess was examined. Edematous mucosa was noted.  No polyp, mass, or lesion was appreciated. Olfactory cleft was clear.  Nasopharynx was clear.  Turbinates were hypertrophied but without mass.  The procedure was repeated on the contralateral side with similar findings.  The patient tolerated the procedure well.  Procedure: Bilateral cerumen disimpaction Anesthesia: None Description: Under the operating microscope, the cerumen is carefully removed with a combination of cerumen currette, alligator forceps, and suction catheters.  After the cerumen is removed, the TMs are noted to be normal.  No mass, erythema, or lesions. The patient tolerated the procedure well.    Assessment: 1.  Bilateral cerumen impaction.  After the disimpaction procedure, both tympanic membranes and middle ear spaces are noted to be normal. 2.  Chronic rhinitis with nasal mucosal congestion and bilateral inferior turbinate hypertrophy.  No polyps, mass, lesion, or purulent drainage is noted today. 3.  The patient's recurrent headaches may be secondary to neurogenic causes.  Plan: 1.  Otomicroscopy with bilateral cerumen disimpaction. 2.  The physical exam and nasal endoscopy findings are reviewed with the patient and her caregiver. 3.  The patient is reassured that no significant infection is noted  today. 4.  Continue with Flonase  and Zyrtec daily.  The importance of consistent daily use is discussed. 5.  The patient will return for reevaluation in 3 months, sooner if needed.  Wynston Romey W Roshana Shuffield 08/31/2023, 6:56 PM

## 2023-09-29 ENCOUNTER — Other Ambulatory Visit: Payer: Self-pay

## 2023-09-29 ENCOUNTER — Emergency Department (HOSPITAL_COMMUNITY)

## 2023-09-29 ENCOUNTER — Emergency Department (HOSPITAL_COMMUNITY)
Admission: EM | Admit: 2023-09-29 | Discharge: 2023-09-29 | Disposition: A | Discharge status: Home / Self Care | Attending: Emergency Medicine | Admitting: Emergency Medicine

## 2023-09-29 ENCOUNTER — Encounter (HOSPITAL_COMMUNITY): Payer: Self-pay | Admitting: *Deleted

## 2023-09-29 DIAGNOSIS — N390 Urinary tract infection, site not specified: Secondary | ICD-10-CM | POA: Diagnosis not present

## 2023-09-29 DIAGNOSIS — Z7984 Long term (current) use of oral hypoglycemic drugs: Secondary | ICD-10-CM | POA: Diagnosis not present

## 2023-09-29 DIAGNOSIS — E876 Hypokalemia: Secondary | ICD-10-CM | POA: Diagnosis not present

## 2023-09-29 DIAGNOSIS — Z7901 Long term (current) use of anticoagulants: Secondary | ICD-10-CM | POA: Insufficient documentation

## 2023-09-29 DIAGNOSIS — R42 Dizziness and giddiness: Secondary | ICD-10-CM | POA: Diagnosis present

## 2023-09-29 LAB — COMPREHENSIVE METABOLIC PANEL WITH GFR
ALT: 9 U/L (ref 0–44)
AST: 25 U/L (ref 15–41)
Albumin: 3.5 g/dL (ref 3.5–5.0)
Alkaline Phosphatase: 116 U/L (ref 38–126)
Anion gap: 14 (ref 5–15)
BUN: 11 mg/dL (ref 8–23)
CO2: 25 mmol/L (ref 22–32)
Calcium: 9 mg/dL (ref 8.9–10.3)
Chloride: 97 mmol/L — ABNORMAL LOW (ref 98–111)
Creatinine, Ser: 0.73 mg/dL (ref 0.44–1.00)
GFR, Estimated: >60 mL/min (ref >60)
Glucose, Bld: 114 mg/dL — ABNORMAL HIGH (ref 70–99)
Potassium: 2.7 mmol/L — CL (ref 3.5–5.1)
Sodium: 136 mmol/L (ref 135–145)
Total Bilirubin: 0.8 mg/dL (ref 0.0–1.2)
Total Protein: 7.6 g/dL (ref 6.5–8.1)

## 2023-09-29 LAB — CBC WITH DIFFERENTIAL/PLATELET
Abs Immature Granulocytes: 0.03 10*3/uL (ref 0.00–0.07)
Basophils Absolute: 0 10*3/uL (ref 0.0–0.1)
Basophils Relative: 0 %
Eosinophils Absolute: 0 10*3/uL (ref 0.0–0.5)
Eosinophils Relative: 0 %
HCT: 36 % (ref 36.0–46.0)
Hemoglobin: 12.1 g/dL (ref 12.0–15.0)
Immature Granulocytes: 0 %
Lymphocytes Relative: 9 %
Lymphs Abs: 0.9 10*3/uL (ref 0.7–4.0)
MCH: 30.6 pg (ref 26.0–34.0)
MCHC: 33.6 g/dL (ref 30.0–36.0)
MCV: 91.1 fL (ref 80.0–100.0)
Monocytes Absolute: 0.5 10*3/uL (ref 0.1–1.0)
Monocytes Relative: 5 %
Neutro Abs: 8.8 10*3/uL — ABNORMAL HIGH (ref 1.7–7.7)
Neutrophils Relative %: 86 %
Platelets: 397 10*3/uL (ref 150–400)
RBC: 3.95 MIL/uL (ref 3.87–5.11)
RDW: 13.8 % (ref 11.5–15.5)
WBC: 10.2 10*3/uL (ref 4.0–10.5)
nRBC: 0 % (ref 0.0–0.2)

## 2023-09-29 LAB — URINALYSIS, ROUTINE W REFLEX MICROSCOPIC
Bilirubin Urine: NEGATIVE
Glucose, UA: NEGATIVE mg/dL
Ketones, ur: NEGATIVE mg/dL
Nitrite: POSITIVE — AB
Protein, ur: NEGATIVE mg/dL
Specific Gravity, Urine: 1.012 (ref 1.005–1.030)
pH: 5 (ref 5.0–8.0)

## 2023-09-29 LAB — MAGNESIUM: Magnesium: 1.7 mg/dL (ref 1.7–2.4)

## 2023-09-29 LAB — PROTIME-INR
INR: 1.5 — ABNORMAL HIGH (ref 0.8–1.2)
Prothrombin Time: 18.1 s — ABNORMAL HIGH (ref 11.4–15.2)

## 2023-09-29 MED ORDER — POTASSIUM CHLORIDE ER 10 MEQ PO TBCR: 10.0000 meq | EXTENDED_RELEASE_TABLET | Freq: Every day | ORAL | 0 refills | Status: AC

## 2023-09-29 MED ORDER — CEPHALEXIN 500 MG PO CAPS
500.0000 mg | ORAL_CAPSULE | Freq: Once | ORAL | Status: AC
  Administered 2023-09-29: 500 mg via ORAL
  Filled 2023-09-29: qty 1

## 2023-09-29 MED ORDER — CEPHALEXIN 500 MG PO CAPS: 500.0000 mg | ORAL_CAPSULE | Freq: Three times a day (TID) | ORAL | 0 refills | Status: AC

## 2023-09-29 MED ORDER — POTASSIUM CHLORIDE CRYS ER 20 MEQ PO TBCR
40.0000 meq | EXTENDED_RELEASE_TABLET | Freq: Once | ORAL | Status: AC
  Administered 2023-09-29: 40 meq via ORAL
  Filled 2023-09-29: qty 2

## 2023-09-29 MED ORDER — POTASSIUM CHLORIDE 10 MEQ/100ML IV SOLN
10.0000 meq | Freq: Once | INTRAVENOUS | Status: AC
  Administered 2023-09-29: 10 meq via INTRAVENOUS
  Filled 2023-09-29: qty 100

## 2023-09-29 NOTE — ED Notes (Signed)
 Patient urinated in brief, changed and patient is dry.

## 2023-09-29 NOTE — ED Notes (Signed)
 Attempted to get an IV twice. The site blew. Medic was able to get some blood. Plebobtomist also attempted to stick patient but was unsuccessful. CN notified that an IV is needed and blood is needed.

## 2023-09-29 NOTE — ED Provider Notes (Signed)
 [facility] EM[facility]GENCY DEPARTMENT AT [facility] Provider Note   CSN: [id number] Arrival date & time: 09/29/23  7829     Patient presents with: Dizziness   Amy Mccoy is a 78 y.o. female.    Dizziness  This patient is an 78 year old female coming from [facility] assisted living facility where she was found to have a fall in her bathroom.  She states that she got dizzy lowered herself to the ground and sat on the ground.  She had no loss of consciousness, she did not hit her head.  When I asked how she was feeling this morning she said terrible but cannot tell me what it means.  When I ask her what the dizziness feels like she states like the room is moving.  Nothing seems to make it better or worse, she denies any numbness or weakness of the arms or the legs but has chronic low back pain from a pinched nerve.  No chest pain or shortness of breath, no nausea vomiting or diarrhea, denies any rashes, no changes in vision, the patient does have some degree of dementia for which she is treated.  Paramedics noted no abnormal vital signs and route    Prior to Admission medications   Medication Sig Start Date End Date Taking? Authorizing Provider  cephALEXin  (KEFLEX ) 500 MG capsule Take 1 capsule (500 mg total) by mouth 3 (three) times daily for 7 days. 09/29/23 10/06/23 Yes Early Glisson, MD  potassium chloride  (KLOR-CON ) 10 MEQ tablet Take 1 tablet (10 mEq total) by mouth daily. 09/29/23  Yes Early Glisson, MD  acetaminophen (TYLENOL) 500 MG tablet Take 500 mg by mouth 3 (three) times daily as needed for mild pain.    [provider]  albuterol (VENTOLIN HFA) 108 (90 Base) MCG/ACT inhaler Inhale 2 puffs into the lungs every 6 (six) hours as needed for wheezing or shortness of breath.    [provider]  alendronate (FOSAMAX) 35 MG tablet Take 35 mg by mouth every Saturday. Take with a full glass of water on an empty stomach.    [provider]  apixaban  (ELIQUIS) 5 MG TABS tablet Take 5 mg by mouth 2 (two) times daily.    [provider]  atorvastatin (LIPITOR) 10 MG tablet Take 10 mg by mouth at bedtime.    [provider]  budesonide-formoterol (SYMBICORT) 160-4.5 MCG/ACT inhaler Inhale 2 puffs into the lungs 2 (two) times daily.    [provider]  calcium-vitamin D (OSCAL WITH D) 500-200 MG-UNIT tablet Take 1 tablet by mouth 2 (two) times daily.    [provider]  docusate sodium (COLACE) 100 MG capsule Take 100 mg by mouth daily.    [provider]  donepezil (ARICEPT) 10 MG tablet Take 10 mg by mouth at bedtime.    [provider]  HYDROcodone-acetaminophen (NORCO/VICODIN) 5-325 MG tablet Take 1 tablet by mouth 2 (two) times daily.    [provider]  loperamide (IMODIUM A-D) 2 MG tablet Take 2 mg by mouth every 4 (four) hours as needed for diarrhea or loose stools.    [provider]  LORazepam (ATIVAN) 0.5 MG tablet Take 0.25 mg by mouth 2 (two) times daily.    [provider]  metFORMIN (GLUCOPHAGE) 500 MG tablet Take 250 mg by mouth 2 (two) times daily with a meal.    [provider]  ondansetron (ZOFRAN) 4 MG tablet Take 4 mg by mouth every 6 (six) hours as  needed for nausea or vomiting.    [provider]  sertraline (ZOLOFT) 100 MG tablet Take 100 mg by mouth daily.    [provider]    Allergies: Patient has no known allergies.    Review of Systems  Neurological:  Positive for dizziness.  All other systems reviewed and are negative.   Updated Vital Signs BP (!) 135/96   Pulse 71   Temp 99.1 F (37.3 C)   Resp 20   Wt 52.2 kg   SpO2 96%   Physical Exam Vitals and nursing note reviewed.  Constitutional:      General: She is not in acute distress.    Appearance: She is well-developed.  HENT:     Head: Normocephalic and atraumatic.     Mouth/Throat:     Pharynx: No oropharyngeal exudate.   Eyes:      General: No scleral icterus.       Right eye: No discharge.        Left eye: No discharge.     Conjunctiva/sclera: Conjunctivae normal.     Pupils: Pupils are equal, round, and reactive to light.   Neck:     Thyroid: No thyromegaly.     Vascular: No JVD.   Cardiovascular:     Rate and Rhythm: Normal rate and regular rhythm.     Heart sounds: Normal heart sounds. No murmur heard.    No friction rub. No gallop.  Pulmonary:     Effort: Pulmonary effort is normal. No respiratory distress.     Breath sounds: Normal breath sounds. No wheezing or rales.  Abdominal:     General: Bowel sounds are normal. There is no distension.     Palpations: Abdomen is soft. There is no mass.     Tenderness: There is no abdominal tenderness.   Musculoskeletal:        General: No tenderness. Normal range of motion.     Cervical back: Normal range of motion and neck supple.  Lymphadenopathy:     Cervical: No cervical adenopathy.   Skin:    General: Skin is warm and dry.     Findings: No erythema or rash.   Neurological:     Mental Status: She is alert.     Coordination: Coordination normal.     Comments: Speech is clear, cranial nerves III through XII are intact, memory is intact, strength is normal in all 4 extremities including grips and strength at the bilateral thighs, knees and ankles to extention and flexion, sensation is intact to light touch and pinprick in all 4 extremities. Coordination as tested by finger-nose-finger is normal, no limb ataxia.   Psychiatric:        Behavior: Behavior normal.     (all labs ordered are listed, but only abnormal results are displayed) Labs Reviewed  CBC WITH DIFFERENTIAL/PLATELET - Abnormal; Notable for the following components:      Result Value   Neutro Abs 8.8 (*)    All other components within normal limits  COMPREHENSIVE METABOLIC PANEL WITH GFR - Abnormal; Notable for the following components:   Potassium 2.7 (*)    Chloride 97 (*)    Glucose,  Bld 114 (*)    All other components within normal limits  URINALYSIS, ROUTINE W REFLEX MICROSCOPIC - Abnormal; Notable for the following components:   APPearance HAZY (*)    Hgb urine dipstick MODERATE (*)    Nitrite POSITIVE (*)    Leukocytes,Ua MODERATE (*)    Bacteria,  UA RARE (*)    All other components within normal limits  PROTIME-INR - Abnormal; Notable for the following components:   Prothrombin Time 18.1 (*)    INR 1.5 (*)    All other components within normal limits  URINE CULTURE  MAGNESIUM    EKG: EKG Interpretation Date/Time:  Saturday Amrita 14 2025 09:49:31 EDT Ventricular Rate:  71 PR Interval:  171 QRS Duration:  136 QT Interval:  461 QTC Calculation: 501 R Axis:   -45  Text Interpretation: 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 EXAM: NECK SOFT TISSUES - 1+ VIEW COMPARISON:  Skull radiographs today. Portable chest radiograph 1039 hours today. Cervical spine CT 04/28/2019. FINDINGS: Two views of the neck also including the upper chest. Chronic cervical spine degeneration with spondylolisthesis. External EKG leads. No retained metal density or radiopaque foreign body identified. Lower lung volumes, otherwise stable visible chest from 1039 hours today. IMPRESSION: No retained metal density or radiopaque foreign body identified in the neck. Chronic cervical spine degeneration with spondylolisthesis. Electronically Signed   By: Marlise Simpers M.D.   On: 09/29/2023 12:00   DG Abd 1 View Result Date: 09/29/2023 CLINICAL DATA:  78 year old female with planned MRI, history of metal exposure. EXAM: ABDOMEN - 1 VIEW COMPARISON:  Pelvis radiograph today. FINDINGS: AP view of the abdomen 1112 hours. Nonobstructed bowel-gas pattern. External EKG leads and wires. No  radiopaque foreign body identified. Levoconvex thoracolumbar scoliosis. Calcified aortic atherosclerosis. No acute osseous abnormality identified. Bipolar left hip arthroplasty again noted, pelvis detailed separately. IMPRESSION: No retained metal density or radiopaque foreign body identified in the abdomen. Aortic Atherosclerosis (ICD10-I70.0). Electronically Signed   By: Marlise Simpers M.D.   On: 09/29/2023 11:57   DG Pelvis 1-2 Views Result Date: 09/29/2023 CLINICAL DATA:  78 year old female with planned MRI, history of metal exposure. EXAM: PELVIS - 1-2 VIEW COMPARISON:  Abdomen radiograph today reported separately. FINDINGS: Left hip bipolar arthroplasty. Femoral heads appear normally aligned. Some osteopenia throughout the pelvis. The patient is upper extremities project over the mid abdomen on this view. Tiny pelvic phleboliths. External EKG leads, pulse oximeter. No other metallic density or radiopaque foreign body identified. Nonobstructed bowel-gas pattern. IMPRESSION: Left hip bipolar arthroplasty which does not preclude MRI. No other metallic density or radiopaque retained foreign body identified in the pelvis. Electronically Signed   By: Marlise Simpers M.D.   On: 09/29/2023 11:56   DG Skull 1-3 Views Result Date: 09/29/2023 CLINICAL DATA:  78 year old female with planned MRI, history of metal exposure. EXAM: SKULL - 1-3 VIEW COMPARISON:  CT head and cervical spine 04/28/2019. FINDINGS: AP and lateral views of the skull. Bone mineralization is within normal limits. Chronic cervical spine degeneration. No retained metal fragment identified. No radiopaque foreign body identified. Negative visible thoracic inlet. IMPRESSION: No metal density or radiopaque foreign body identified in the head or neck. Electronically Signed   By: Marlise Simpers M.D.   On: 09/29/2023 11:55   DG Chest Port 1 View Result Date: 09/29/2023 EXAM: 1 VIEW XRAY OF THE CHEST 09/29/2023 10:55:26 AM COMPARISON: 1 view chest x-ray 09/26/2019. CLINICAL  HISTORY: FINDINGS: LUNGS AND PLEURA: Changes of COPD are again noted. No focal pulmonary opacity. No pulmonary edema. No pleural effusion. No pneumothorax. HEART AND MEDIASTINUM: No acute abnormality of the cardiac and  mediastinal silhouettes. A moderate-sized hiatal hernia is present. BONES AND SOFT TISSUES: Rightward curvature of the thoracic spine is similar to the prior study. No acute osseous abnormality. IMPRESSION: 1. No acute cardiopulmonary pathology. 2. Changes of COPD. 3. Moderate-sized hiatal hernia. Electronically signed by: Audree Leas MD 09/29/2023 11:21 AM EDT RP Workstation: [id number]     Procedures   Medications Ordered in the ED  cephALEXin  (KEFLEX ) capsule 500 mg (500 mg Oral Given 09/29/23 1145)  potassium chloride  10 mEq in 100 mL IVPB (0 mEq Intravenous Stopped 09/29/23 1358)  potassium chloride  SA (KLOR-CON  M) CR tablet 40 mEq (40 mEq Oral Given 09/29/23 1145)                                    Medical Decision Making Amount and/or Complexity of Data Reviewed Labs: ordered. Radiology: ordered. ECG/medicine tests: ordered.  Risk Prescription drug management.   The patient is on Eliquis, she has a headache which she states is a migraine that she gets all the time.  It is difficult to know how much of the history is accurate given the underlying cognitive decline however at this time the patient will have EKG and labs, likely will need an MRI, she does not appear to be in any distress and does not have any focal deficits on my exam.  Labs:  I  personally viewed and interpreted the labs which show urinalysis reveals urinary tract infection, labs show that she is hypokalemic   Radiology Imaging: I personally viewed the images of the ordered radiographic studies and find no findings of stroke on MRI I agree with the radiologist interpretation as well   Meds / Interventions: while in the ED the patient received the following: Potassium administered as well as  antibiotics The response to the interventions was that the patient improved   ED course: The patient did extremely well, there was no significant abnormal vital signs, she was not tachycardic hypotensive or febrile.  The patient stable for discharge back to her nursing facility      Final diagnoses:  Urinary tract infection without hematuria, site unspecified  Hypokalemia    ED Discharge Orders          Ordered    cephALEXin  (KEFLEX ) 500 MG capsule  3 times daily        09/29/23 1457    potassium chloride  (KLOR-CON ) 10 MEQ tablet  Daily        09/29/23 1457               Early Glisson, MD 09/29/23 1459

## 2023-09-29 NOTE — ED Notes (Signed)
 Patient transported to MRI

## 2023-09-29 NOTE — ED Triage Notes (Addendum)
 BIB [facility] from [facility] for dizziness. Onset yesterday, with subsequent loss of balance this am, and fall described as slid down onto buttocks. Denies fall with impact, hitting head or LOC. Mentions recent cold sx and cough with cough med last week. Also chronic hip pain from pinched nerve. Denies NVD, fever, CP, sob. Alert, NAD, calm, interactive. EDP into room. VSS. EMS reports unequal pupils 49mm-2mm.

## 2023-09-29 NOTE — Discharge Instructions (Signed)
 Your testing shows that you have a urinary infection, the MRI of your brain showed no signs of stroke, you also have low potassium, please take the medications exactly as prescribed and follow-up with your doctor within 48 hours for recheck, ER for worsening symptoms

## 2023-09-29 NOTE — ED Notes (Signed)
 In and out cath completed, pt brief was saturated with very foul smelling urine, pt skin warm to touch, rectal temp completed, peri care and new brief applied

## 2023-09-29 NOTE — ED Notes (Signed)
 Necklace given to security.

## 2023-10-01 LAB — URINE CULTURE: Culture: 50000 — AB

## 2023-10-02 ENCOUNTER — Telehealth (HOSPITAL_BASED_OUTPATIENT_CLINIC_OR_DEPARTMENT_OTHER): Payer: Self-pay | Admitting: *Deleted

## 2023-10-02 NOTE — Telephone Encounter (Signed)
 Post ED Visit - Positive Culture Follow-up: Successful Patient Follow-Up  Culture assessed and recommendations reviewed by:  [x]  Sheryle Donning, Pharm.D. []  Skeet Duke, Pharm.D., BCPS AQ-ID []  Leslee Rase, Pharm.D., BCPS []  Garland Junk, Pharm.D., BCPS []  [place], [address].D., BCPS, AAHIVP []  Alcide Aly, Pharm.D., BCPS, AAHIVP []  Jerri Morale, PharmD, BCPS []  Graham Laws, PharmD, BCPS []  Cleda Curly, PharmD, BCPS []  Tamar Fairly, PharmD  Positive urine culture  []  Patient discharged without antimicrobial prescription and treatment is now indicated [x]  Organism is resistant to prescribed ED discharge antimicrobial []  Patient with positive blood cultures  Changes discussed with ED provider: Sonnie Dusky, PA-C.  Pt presented with dizziness and chronic back pain.  D/t pt mentation, unable to determine if having other urinary sx.  Tx for UTI based on UA.  Given uncomplicated UTI, recommend switching to Macrobid 100 mg po BID x 3 days. Faxed to Ena Harries at [facility] [phone].  Contacted patient, date 10/02/23, time 0840   Zeb Heys 10/02/2023, 8:40 AM

## 2023-10-05 ENCOUNTER — Encounter (INDEPENDENT_AMBULATORY_CARE_PROVIDER_SITE_OTHER): Payer: Self-pay | Admitting: Otolaryngology

## 2023-10-31 ENCOUNTER — Encounter (INDEPENDENT_AMBULATORY_CARE_PROVIDER_SITE_OTHER): Payer: Self-pay | Admitting: Otolaryngology

## 2023-10-31 ENCOUNTER — Ambulatory Visit (INDEPENDENT_AMBULATORY_CARE_PROVIDER_SITE_OTHER): Admitting: Otolaryngology

## 2023-10-31 VITALS — BP 105/64 | HR 63

## 2023-10-31 DIAGNOSIS — R519 Headache, unspecified: Secondary | ICD-10-CM | POA: Diagnosis not present

## 2023-10-31 DIAGNOSIS — J310 Chronic rhinitis: Secondary | ICD-10-CM | POA: Diagnosis not present

## 2023-10-31 DIAGNOSIS — R0981 Nasal congestion: Secondary | ICD-10-CM

## 2023-10-31 DIAGNOSIS — J343 Hypertrophy of nasal turbinates: Secondary | ICD-10-CM | POA: Diagnosis not present

## 2023-10-31 DIAGNOSIS — J324 Chronic pansinusitis: Secondary | ICD-10-CM

## 2023-11-01 DIAGNOSIS — J324 Chronic pansinusitis: Secondary | ICD-10-CM | POA: Insufficient documentation

## 2023-11-01 NOTE — Progress Notes (Signed)
 Patient ID: Colleen Aguilar, female   DOB: [birth year], 78 y.o.   MRN: [id number]  Follow-up: Chronic nasal congestion, recurrent headaches  HPI: The patient is a 78 year old female who returns today for her follow-up evaluation.  The patient was last seen in May 2025.  At that time, she was complaining of recurrent headaches, recurrent sinusitis, and chronic nasal congestion.  She was noted to have nasal mucosal congestion and bilateral inferior turbinate hypertrophy.  She was treated with Flonase  and Zyrtec daily.  The patient returns today complaining of persistent nasal congestion.  She is still having frequent headaches.  She denies any visual change or fever.  Exam: General: Communicates without difficulty, well nourished, no acute distress. Head: Normocephalic, no evidence injury, no tenderness, facial buttresses intact without stepoff. Face/sinus: No tenderness to palpation and percussion. Facial movement is normal and symmetric. Eyes: PERRL, EOMI. No scleral icterus, conjunctivae clear. Neuro: CN II exam reveals vision grossly intact.  No nystagmus at any point of gaze. Ears: Auricles well formed without lesions.  Ear canals are intact without mass or lesion.  No erythema or edema is appreciated.  The TMs are intact without fluid. Nose: External evaluation reveals normal support and skin without lesions.  Dorsum is intact.  Anterior rhinoscopy reveals congested mucosa over anterior aspect of inferior turbinates and intact septum.  No purulence noted. Oral:  Oral cavity and oropharynx are intact, symmetric, without erythema or edema.  Mucosa is moist without lesions. Neck: Full range of motion without pain.  There is no significant lymphadenopathy.  No masses palpable.  Thyroid bed within normal limits to palpation.  Parotid glands and submandibular glands equal bilaterally without mass.  Trachea is midline. Neuro:  CN 2-12 grossly intact.   Assessment: 1.  Chronic rhinitis with nasal mucosal congestion  and bilateral inferior turbinate hypertrophy. 2.  History of recurrent sinusitis and recurrent headaches.  No purulent drainage is noted today.  Plan: 1.  The physical exam findings are reviewed with the patient. 2.  Continue with Flonase  and Zyrtec daily. 3.  In light of her persistent symptoms, I would like to obtain a sinus CT scan to evaluate for chronic rhinosinusitis. 4.  The patient will return for reevaluation after the CT scan.

## 2023-11-26 ENCOUNTER — Other Ambulatory Visit (INDEPENDENT_AMBULATORY_CARE_PROVIDER_SITE_OTHER): Payer: Self-pay | Admitting: Otolaryngology

## 2023-11-26 DIAGNOSIS — J324 Chronic pansinusitis: Secondary | ICD-10-CM

## 2023-11-28 ENCOUNTER — Ambulatory Visit (INDEPENDENT_AMBULATORY_CARE_PROVIDER_SITE_OTHER): Admitting: Otolaryngology

## 2023-12-20 ENCOUNTER — Ambulatory Visit (INDEPENDENT_AMBULATORY_CARE_PROVIDER_SITE_OTHER): Admitting: Otolaryngology

## 2024-01-10 ENCOUNTER — Ambulatory Visit (HOSPITAL_COMMUNITY)
Admission: RE | Admit: 2024-01-10 | Discharge: 2024-01-10 | Disposition: A | Discharge status: Home / Self Care | Source: Ambulatory Visit | Attending: Otolaryngology | Admitting: Otolaryngology

## 2024-01-10 DIAGNOSIS — J324 Chronic pansinusitis: Secondary | ICD-10-CM | POA: Diagnosis present

## 2024-05-15 ENCOUNTER — Ambulatory Visit: Admitting: Internal Medicine

## 2024-06-09 ENCOUNTER — Ambulatory Visit: Admitting: Internal Medicine
